# Patient Record
Sex: Male | Born: 1960 | Race: White | Hispanic: No | Marital: Single | Smoking: Former smoker
Health system: Southern US, Community
[De-identification: ages and names within clinical notes are randomized; demographics above are authoritative.]

## PROBLEM LIST (undated history)

## (undated) DIAGNOSIS — L409 Psoriasis, unspecified: Secondary | ICD-10-CM

## (undated) DIAGNOSIS — T4145XA Adverse effect of unspecified anesthetic, initial encounter: Secondary | ICD-10-CM

## (undated) DIAGNOSIS — R55 Syncope and collapse: Principal | ICD-10-CM

## (undated) SURGERY — BRONCHOSCOPY, WITH FLUOROSCOPY
Anesthesia: Moderate Sedation

---

## 1983-11-07 DIAGNOSIS — T8859XA Other complications of anesthesia, initial encounter: Secondary | ICD-10-CM

## 1983-11-07 HISTORY — PX: TONSILLECTOMY: SUR1361

## 1983-11-07 HISTORY — DX: Other complications of anesthesia, initial encounter: T88.59XA

## 1999-11-16 ENCOUNTER — Emergency Department (HOSPITAL_COMMUNITY): Admission: EM | Admit: 1999-11-16 | Discharge: 1999-11-16 | Payer: Self-pay | Admitting: Emergency Medicine

## 2007-02-21 ENCOUNTER — Ambulatory Visit: Payer: Self-pay | Admitting: Gastroenterology

## 2007-02-25 ENCOUNTER — Ambulatory Visit: Payer: Self-pay | Admitting: Gastroenterology

## 2007-07-06 ENCOUNTER — Emergency Department (HOSPITAL_COMMUNITY): Admission: EM | Admit: 2007-07-06 | Discharge: 2007-07-06 | Payer: Self-pay | Admitting: Emergency Medicine

## 2011-03-24 NOTE — Assessment & Plan Note (Signed)
[facility]                         GASTROENTEROLOGY OFFICE NOTE   NAME:[name], [name]                         MRN:          [id number]  DATE:02/21/2007                            DOB:          Dec 31, 1960    Mr. [name] [name] is a 50 year old white male who has been referred through the  courtesy of Dr. Collins [name] for evaluation of rectal bleeding and melena.   Mr. [name] [name] has been in excellent health all of his life without medical  problems.  Approximately a month he developed acute gastroenteritis with  nausea and vomiting, epigastric crampy lower abdominal pain, and  diarrhea with some dark black stools and bright red blood per rectum.  This resolved spontaneously.  He is having regular bowel movements at  this time, but continues with periodic crampy lower abdominal pain  lasting 1 to 2 hours, usually precipitated by eating.  He has had no  diarrhea or further bleeding, although he has had intermittent  hemorrhoidal bleeding for years.  His appetite is good, and his weight  is stable.  He denies specific systemic complaints.  He specifically  denies reflux, dyspepsia, any history of hepatitis or pancreatitis.  He  has not had foreign travel, known infectious disease exposure, or  recurrent antibiotic use.  He was prescribed Levsin by Dr. Collins [name], which  seems to alleviate his pain greatly.  He has never had colonoscopy or  barium studies of his bowels.  Laboratory data from Dr. Collins [name] is  pending.   PAST MEDICAL HISTORY:  Otherwise noncontributory.   FAMILY HISTORY:  Noncontributory in terms of GI illnesses.   SOCIAL HISTORY:  The patient is single and lives with a roommate.  He  has a high school education, and works as a Manufacturing systems engineer.  He  smokes a pack of cigarettes per day.  Denies ethanol use.   REVIEW OF SYSTEMS:  Noncontributory.   EXAMINATION:  He is a healthy-appearing white male, in no distress,  appearing his stated age.  He is 6 feet 3  inches tall, and weighs 242 pounds.  Blood pressure is  114/70, and pulse was 72 and regular.  I could not appreciate stigmata of chronic liver disease.  CHEST:  Showed scattered wheezes and rhonchi throughout both lung  fields.  He was in a regular rhythm without significant murmurs, gallops, or  rubs.  I could not appreciate hepatosplenomegaly, abdominal masses, or  tenderness.  His bowel sounds were normal.  EXTREMITIES:  Were unremarkable.  Mental status was clear.  RECTAL EXAM:  Was deferred.   ASSESSMENT:  Mr. 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   RECOMMENDATIONS:  I have gone ahead and set Mr. [name] [name] up for colonoscopy  at his convenience.  I have asked him to continue to use p.r.n.  sublingual Levsin every 4 to 6 hours.  Pending his colonoscopy results,  we will proceed as needed.     [name] Rea. Jarold Motto, MD, Caleen Essex, FAGA  Electronically Signed    DRP/MedQ  DD:  02/21/2007  DT: 02/21/2007  Job #: [id number]   cc:   [name] [name], M.D.

## 2012-09-25 ENCOUNTER — Emergency Department (HOSPITAL_COMMUNITY): Payer: BC Managed Care – PPO

## 2012-09-25 ENCOUNTER — Inpatient Hospital Stay (HOSPITAL_COMMUNITY): Payer: BC Managed Care – PPO

## 2012-09-25 ENCOUNTER — Observation Stay (HOSPITAL_COMMUNITY)
Admission: EM | Admit: 2012-09-25 | Discharge: 2012-09-27 | Disposition: A | Discharge status: Other Acute Inpatient Hospital | Payer: BC Managed Care – PPO | Attending: Internal Medicine | Admitting: Internal Medicine

## 2012-09-25 ENCOUNTER — Observation Stay: Admit: 2012-09-25 | Payer: Self-pay | Admitting: Internal Medicine

## 2012-09-25 ENCOUNTER — Encounter (HOSPITAL_COMMUNITY): Payer: Self-pay | Admitting: Emergency Medicine

## 2012-09-25 DIAGNOSIS — Z79899 Other long term (current) drug therapy: Secondary | ICD-10-CM | POA: Insufficient documentation

## 2012-09-25 DIAGNOSIS — I2510 Atherosclerotic heart disease of native coronary artery without angina pectoris: Secondary | ICD-10-CM

## 2012-09-25 DIAGNOSIS — R9389 Abnormal findings on diagnostic imaging of other specified body structures: Secondary | ICD-10-CM

## 2012-09-25 DIAGNOSIS — R9431 Abnormal electrocardiogram [ECG] [EKG]: Secondary | ICD-10-CM

## 2012-09-25 DIAGNOSIS — J189 Pneumonia, unspecified organism: Secondary | ICD-10-CM

## 2012-09-25 DIAGNOSIS — R918 Other nonspecific abnormal finding of lung field: Secondary | ICD-10-CM | POA: Insufficient documentation

## 2012-09-25 DIAGNOSIS — L408 Other psoriasis: Secondary | ICD-10-CM | POA: Insufficient documentation

## 2012-09-25 DIAGNOSIS — F17200 Nicotine dependence, unspecified, uncomplicated: Secondary | ICD-10-CM | POA: Insufficient documentation

## 2012-09-25 DIAGNOSIS — R55 Syncope and collapse: Principal | ICD-10-CM

## 2012-09-25 DIAGNOSIS — L409 Psoriasis, unspecified: Secondary | ICD-10-CM | POA: Diagnosis present

## 2012-09-25 HISTORY — DX: Syncope and collapse: R55

## 2012-09-25 HISTORY — DX: Adverse effect of unspecified anesthetic, initial encounter: T41.45XA

## 2012-09-25 HISTORY — DX: Psoriasis, unspecified: L40.9

## 2012-09-25 LAB — BASIC METABOLIC PANEL
CO2: 30 mEq/L (ref 19–32)
Chloride: 101 mEq/L (ref 96–112)
Creatinine, Ser: 1.01 mg/dL (ref 0.50–1.35)
GFR calc Af Amer: >90 mL/min (ref >90)
Potassium: 4.5 mEq/L (ref 3.5–5.1)

## 2012-09-25 LAB — CBC WITH DIFFERENTIAL/PLATELET
Basophils Absolute: 0.1 10*3/uL (ref 0.0–0.1)
Basophils Relative: 1 % (ref 0–1)
HCT: 51.9 % (ref 39.0–52.0)
Hemoglobin: 17.9 g/dL — ABNORMAL HIGH (ref 13.0–17.0)
Lymphocytes Relative: 25 % (ref 12–46)
Monocytes Absolute: 0.7 10*3/uL (ref 0.1–1.0)
Monocytes Relative: 9 % (ref 3–12)
Neutro Abs: 5 10*3/uL (ref 1.7–7.7)
Neutrophils Relative %: 63 % (ref 43–77)
RDW: 14.4 % (ref 11.5–15.5)
WBC: 7.9 10*3/uL (ref 4.0–10.5)

## 2012-09-25 LAB — POCT I-STAT TROPONIN I: Troponin i, poc: 0 ng/mL (ref 0.00–0.08)

## 2012-09-25 MED ORDER — IOHEXOL 350 MG/ML SOLN
100.0000 mL | Freq: Once | INTRAVENOUS | Status: AC | PRN
  Administered 2012-09-25: 100 mL via INTRAVENOUS

## 2012-09-25 NOTE — ED Provider Notes (Signed)
Patient to CDU from Pod A, handoff from [facility] -- patient to ED with 3 episodes of syncope. First was 2 weeks ago, no prodrome. Saw PCP and had labs -- was told he was dehydrated. 2nd episode while driving with brief LOC and brief episode of preceding dizziness. Pt had another episode today where he experienced prodrome of SOB and palps. He was able to sit down prior to syncope.   Was found to have abnormal CXR and CT chest was ordered.   No PE however patient has concerning findings, possible fungal infection. CT reviewed by myself.   Patient takes Enbrel twice a week for psoriasis. Possible cause of immunocompromise. He denies cough, SOB, fever, night sweats.   No travel, has worked at [employer] for 20 years.   Case discussed with Dr. Adela Glimpse who will see and admit. Airborne precautions ordered. Patient and family informed of findings and plan. They are in agreement.   11:12 PM Exam:  Gen NAD; Heart RRR, nml S1,S2, no m/r/g; Lungs CTAB; Abd soft, NT, no rebound or guarding; Ext 2+ pedal pulses bilaterally, no edema.     Renne Crigler, [place] 09/25/12 2312

## 2012-09-25 NOTE — ED Notes (Signed)
Patient transported to CT 

## 2012-09-25 NOTE — ED Notes (Signed)
Report given to Kristin, RN

## 2012-09-25 NOTE — ED Notes (Signed)
Pt c/o syncopal episode today with palpitations and SOB and hyperventilation prior to event; pt sts sat down and did not fall; pt sts hx of similar 2 weeks ago

## 2012-09-25 NOTE — H&P (Signed)
PCP:   PERKINS, Marlowe Sax, PA [employer] [place]   Chief Complaint:   syncope  HPI: [name] [name] is a 51 y.o. male   has no past medical history on file.   Presented with  Today at around 3:30 pm he started to feel that his heart was racing he felt light headed and set on the floor and then lost conciseness for few seconds. Laying down helped immediately. He had prior episodes. Once when he was standing up he synopsized without warning. Last week he synopsized while driving with presyncopal prodrome.  NO shortness of breath no chest pain. Denies any cough, fever , chills or pulmonary complaints. He have had negative PPD done prior to initiation of enbrel and every 6 months there after. No known exposure to Tb and no risk factors.   CT showed possible pulmonary infection AFB vs aspergillosis vs nocardia on differential.    Review of Systems:    Pertinent positives include: palpitations. weight loss (30Lb over 3 months) he was trying to be healthy and was exersising.   Constitutional:  No weight loss, night sweats, Fevers, chills, fatigue,  HEENT:  No headaches, Difficulty swallowing,Tooth/dental problems,Sore throat,  No sneezing, itching, ear ache, nasal congestion, post nasal drip,  Cardio-vascular:  No chest pain, Orthopnea, PND, anasarca, dizziness,no Bilateral lower extremity swelling  GI:  No heartburn, indigestion, abdominal pain, nausea, vomiting, diarrhea, change in bowel habits, loss of appetite, melena, blood in stool, hematemesis Resp:  no shortness of breath at rest. No dyspnea on exertion, No excess mucus, no productive cough, No non-productive cough, No coughing up of blood.No change in color of mucus.No wheezing. Skin:  no rash or lesions. No jaundice GU:  no dysuria, change in color of urine, no urgency or frequency. No straining to urinate.  No flank pain.  Musculoskeletal:  No joint pain or no joint swelling. No decreased range of motion. No back pain.  Psych:    No change in mood or affect. No depression or anxiety. No memory loss.  Neuro: no localizing neurological complaints, no tingling, no weakness, no double vision, no gait abnormality, no slurred speech, no confusion  Otherwise ROS are negative except for above, 10 systems were reviewed  Past Medical History: History reviewed. No pertinent past medical history. Past Surgical History  Procedure Date  . Tonsillectomy      Medications: Prior to Admission medications   Medication Sig Start Date End Date Taking? Authorizing Provider  etanercept (ENBREL) 50 MG/ML injection Inject 50 mg into the skin 2 (two) times a week. Wednesday and Sunday   Yes Historical Provider, MD    Allergies:  No Known Allergies  Social History:  Ambulatory independently Lives at  home   reports that he has been smoking Cigarettes.  He has been smoking about .5 packs per day. He does not have any smokeless tobacco history on file. He reports that he does not drink alcohol or use illicit drugs.   Family History: family history includes Diabetes type II in his brother and father; Heart disease in his other; and Pulmonary fibrosis in his mother.    Physical Exam: Patient Vitals for the past 24 hrs:  BP Temp Temp src Pulse Resp SpO2  09/25/12 2200 124/86 mmHg - - 77  21  96 %  09/25/12 2138 117/74 mmHg - Oral 70  18  100 %  09/25/12 1930 111/75 mmHg - - 65  19  98 %  09/25/12 1830 122/84 mmHg - - 66  19  97 %  09/25/12 1759 127/71 mmHg 98.9 F (37.2 C) Oral 66  24  97 %  09/25/12 1659 152/84 mmHg 98 F (36.7 C) Oral 93  18  97 %    1. General:  in No Acute distress 2. Psychological: Alert and Oriented 3. Head/ENT:   Dry Mucous Membranes                          Head Non traumatic, neck supple                          Normal  Dentition 4. SKIN:  decreased Skin turgor,  Skin clean Dry and intact no rash 5. Heart: Regular rate and rhythm no Murmur, Rub or gallop 6. Lungs: occasional wheezes coarse  breath sounds bilaterally grossly abnormal. 7. Abdomen: Soft, non-tender, Non distended 8. Lower extremities: no clubbing, cyanosis, or edema 9. Neurologically Grossly intact, moving all 4 extremities equally 10. MSK: Normal range of motion  body mass index is unknown because there is no height or weight on file.   Labs on Admission:   [facility] 09/25/12 1701  NA 140  K 4.5  CL 101  CO2 30  GLUCOSE 80  BUN 12  CREATININE 1.01  CALCIUM 9.8  MG --  PHOS --   No results found for this basename: AST:2,ALT:2,ALKPHOS:2,BILITOT:2,PROT:2,ALBUMIN:2 in the last 72 hours No results found for this basename: LIPASE:2,AMYLASE:2 in the last 72 hours  Basename 09/25/12 1701  WBC 7.9  NEUTROABS 5.0  HGB 17.9*  HCT 51.9  MCV 90.9  PLT 183   No results found for this basename: CKTOTAL:3,CKMB:3,CKMBINDEX:3,TROPONINI:3 in the last 72 hours No results found for this basename: TSH,T4TOTAL,FREET3,T3FREE,THYROIDAB in the last 72 hours No results found for this basename: VITAMINB12:2,FOLATE:2,FERRITIN:2,TIBC:2,IRON:2,RETICCTPCT:2 in the last 72 hours No results found for this basename: HGBA1C    CrCl is unknown because there is no height on file for the current visit. ABG No results found for this basename: phart, pco2, po2, hco3, tco2, acidbasedef, o2sat     No results found for this basename: DDIMER     Other results:  I have pearsonaly reviewed this: ECG REPORT  Rate: 88  Rhythm: NSR ST&T Change: no ischemic changes   Cultures: No results found for this basename: sdes, specrequest, cult, reptstatus       Radiological Exams on Admission: Dg Chest 2 View  09/25/2012  *RADIOLOGY REPORT*  Clinical Data: Passed out work.  CHEST - 2 VIEW  Comparison: None.  Findings: Two-view exam shows diffuse interstitial and associated patchy bilateral alveolar opacities with relative sparing of the bases and apices.  Prominence of the main pulmonary arteries raises the question of pulmonary  arterial hypertension. The cardiopericardial silhouette is within normal limits for size. Imaged bony structures of the thorax are intact.  IMPRESSION: Diffuse symmetric interstitial and alveolar disease involving the central lungs without cardiomegaly or Kerley B lines. Imaging features may be related to an infectious or inflammatory process. Pulmonary hemorrhage is not completely excluded.  Prominence of the main pulmonary arteries.   Original Report Authenticated By: Kennith Center, M.D.    Ct Angio Chest W/cm &/or Wo Cm  09/25/2012  *RADIOLOGY REPORT*  Clinical Data: Palpitations.  Loss of consciousness.  Short of breath.  CT ANGIOGRAPHY CHEST  Technique:  Multidetector CT imaging of the chest using the standard protocol during bolus administration of intravenous contrast. Multiplanar reconstructed images including  MIPs were obtained and reviewed to evaluate the vascular anatomy.  Contrast: OMNIPAQUE IOHEXOL 350 MG/ML SOLN  Comparison: None.  Findings: There are no filling defects in the pulmonary arterial tree to suggest acute pulmonary thromboembolism.  Mediastinal and hilar adenopathy are present.  14 mm right paratracheal node on image 29.  9 mm right paratracheal node on image 22.  1.7 cm subcarinal node on image 41.  2.1 cm right hilar node on image 38.  1.7 cm left hilar node on image 41.  All of the nodal tissue contains calcifications.  No pericardial effusion.  No pneumothorax.  No pleural effusion.  Lungs are abnormal.  There are heterogeneous opacities throughout both lungs with a central and upper lobe distribution.  This is characterized by ground-glass opacities, ill-defined pulmonary opacities, and ground-glass opacities.  This is associated with bilateral apical bronchiectasis.  The largest focal opacity in the right upper lobe on image 30 measures 1.7 x 1.4 cm.  There is no subpleural nodularity or nodularity within the airways.  No destructive bone lesion.  No obvious aortic  dissection.  Atherosclerotic calcifications of the LAD are noted.  Normal thyroid gland.  IMPRESSION: No evidence of acute pulmonary thromboembolism.  The constellation of findings include mediastinal and hilar adenopathy containing calcifications associated with heterogeneous bilateral upper lobe opacities and bronchiectasis.  Allergic bronchopulmonary aspergillosis can cause bilateral upper lobe bronchiectasis and the remainder of the findings can be related to fungal infection. Also consider mycobacterium infection. Calcified mediastinal nodes can be seen with treated lymphoma, sarcoidosis, or pneumoconiosis.   Original Report Authenticated By: Jolaine Click, M.D.     Chart has been reviewed  Assessment/Plan  50 yo M with episodes of syncope and grossly abnormal CT of the chest.  Present on Admission:  . Syncope - will admit for father syncope workup. Given some sensations of palpitations would ensure get echo gram and monitor in telemetry for dysrhythmia,  We'll cycle cardiac markers obtained carotid Dopplers. Given abnormal chest CT findings and possibility of Nocardia will obtain MRI of her brain to rule out CNS involvement. Sarcoidosis can affect both lungs and heart although patient is in wrong demographics for that.  . Abnormal chest CT - differential is a rather broad, there's evidence of bronchiectasis. Possibility of infectious process in this patient with immune suppression. Fungal versus mycobacterial infection source. Will put on airborne precautions Spoke with pulmonology who will see him in the morning. May need possible bronchoscopy. Will order AFB cultures. For completion sake we'll get HIV serology.  Prophylaxis:  Lovenox, Protonix  CODE STATUS: FULL CODE  Other plan as per orders.  I have spent a total of  65 min on this admission. Time taken to speak with radiology as well as pulmonology  [name] [name] 09/25/2012, 11:51 PM

## 2012-09-25 NOTE — ED Provider Notes (Signed)
History     CSN: [id number]  Arrival date & time 09/25/12  1648   First MD Initiated Contact with Patient 09/25/12 1758      Chief Complaint  Patient presents with  . Loss of Consciousness  . Palpitations    (Consider location/radiation/quality/duration/timing/severity/associated sxs/prior treatment) HPI Comments: Patient is a 51 year old male with a significant family medical history of early heart disease who presents after a syncopal episode at work. Patient reports sitting at work and feeling heart palpitations and light-headed and proceeding to lose consciousness. Patient reports witnesses say he was out for a few seconds before waking up. Patient reports residual "shakiness" and palpitations after waking up. He denies head trauma. Patient reports 2 other syncopal episodes in the past 2 weeks -- one occurred while he was at home and the other while he was driving. The first 2 episodes, the patient denies the associated palpitations and "shakiness." No aggravating/alleviating factors. Patient denies current symptoms. Patient denies headache, visual changes, neck pain, chest pain, SOB, abdominal pain, NVD, numbness/tingling. Patient is a current everyday smoker.   Patient is a 51 y.o. male presenting with syncope and palpitations.  Loss of Consciousness  Palpitations  Associated symptoms include syncope.    History reviewed. No pertinent past medical history.  History reviewed. No pertinent past surgical history.  History reviewed. No pertinent family history.  History  Substance Use Topics  . Smoking status: Current Every Day Smoker  . Smokeless tobacco: Not on file  . Alcohol Use: No      Review of Systems  Cardiovascular: Positive for palpitations and syncope.  Neurological: Positive for syncope.  All other systems reviewed and are negative.    Allergies  Review of patient's allergies indicates no known allergies.  Home Medications   Current Outpatient Rx    Name  Route  Sig  Dispense  Refill  . ETANERCEPT 50 MG/ML [place] SOLN   Subcutaneous   Inject 50 mg into the skin 2 (two) times a week. Wednesday and Sunday           BP 127/71  Pulse 66  Temp 98.9 F (37.2 C) (Oral)  Resp 24  SpO2 97%  Physical Exam  Nursing note and vitals reviewed. Constitutional: He is oriented to person, place, and time. He appears well-developed and well-nourished. No distress.  HENT:  Head: Normocephalic and atraumatic.  Eyes: Conjunctivae normal and EOM are normal. Pupils are equal, round, and reactive to light. No scleral icterus.  Neck: Normal range of motion. Neck supple.  Cardiovascular: Normal rate and regular rhythm.  Exam reveals no gallop and no friction rub.   No murmur heard. Pulmonary/Chest: Effort normal. He has wheezes. He has rales. He exhibits no tenderness.       Diffuse rales and wheezes noted throughout bilateral lung fields.   Abdominal: Soft. He exhibits no distension. There is no tenderness. There is no rebound and no guarding.  Musculoskeletal: Normal range of motion.  Neurological: He is alert and oriented to person, place, and time. No cranial nerve deficit. Coordination normal.       Strength and sensation equal and intact bilaterally. Cerebellar testing done without difficulty. Speech is goal-oriented. Moves limbs without ataxia.   Skin: Skin is warm and dry. He is not diaphoretic.  Psychiatric: He has a normal mood and affect. His behavior is normal.    ED Course  Procedures (including critical care time)  Labs Reviewed  CBC WITH DIFFERENTIAL - Abnormal; Notable for the  following:    Hemoglobin 17.9 (*)     All other components within normal limits  BASIC METABOLIC PANEL - Abnormal; Notable for the following:    GFR calc non Af Amer 85 (*)     All other components within normal limits  POCT I-STAT TROPONIN I   Dg Chest 2 View  09/25/2012  *RADIOLOGY REPORT*  Clinical Data: Passed out work.  CHEST - 2 VIEW  Comparison:  None.  Findings: Two-view exam shows diffuse interstitial and associated patchy bilateral alveolar opacities with relative sparing of the bases and apices.  Prominence of the main pulmonary arteries raises the question of pulmonary arterial hypertension. The cardiopericardial silhouette is within normal limits for size. Imaged bony structures of the thorax are intact.  IMPRESSION: Diffuse symmetric interstitial and alveolar disease involving the central lungs without cardiomegaly or Kerley B lines. Imaging features may be related to an infectious or inflammatory process. Pulmonary hemorrhage is not completely excluded.  Prominence of the main pulmonary arteries.   Original Report Authenticated By: Kennith Center, M.D.      1. Syncope   2. Pulmonary infection       MDM  6:12 PM Labs pending. Chest xray shows diffuse symmetrical interstitial and alveolar disease and prominence of main pulmonary arteries.    7:08 PM Patient will have CT angio of chest due to concerning chest xray. Patient will go to CDU. Patient signed out to [facility], PA-C      [place], [place] 09/25/12 [phone]

## 2012-09-25 NOTE — ED Notes (Signed)
Pt A.O. X 4. Respirations even and regular. Vitals stable. Skin warm,dry, intact. Denies Pain. Denies SOB. Denies Chest pain. Family at bedside. No further needs at this time. Up dated on plan of care. Aware that Hospitalist will becoming to assess him and continue his plan of care.

## 2012-09-25 NOTE — ED Notes (Signed)
Pt returned from CT °

## 2012-09-25 NOTE — ED Notes (Signed)
Pt reports a syncopal episode today; pt states he has had previous syncopal episodes in the past with his last episode being last Wednesday; pt reports that today he had palpitations prior to today's episode and that he was nausea this morning;

## 2012-09-26 ENCOUNTER — Observation Stay (HOSPITAL_COMMUNITY): Payer: BC Managed Care – PPO

## 2012-09-26 ENCOUNTER — Encounter (HOSPITAL_COMMUNITY): Payer: Self-pay | Admitting: General Practice

## 2012-09-26 ENCOUNTER — Encounter (HOSPITAL_COMMUNITY)
Admission: EM | Disposition: A | Discharge status: Other Acute Inpatient Hospital | Payer: Self-pay | Source: Home / Self Care | Attending: Emergency Medicine

## 2012-09-26 DIAGNOSIS — J984 Other disorders of lung: Secondary | ICD-10-CM

## 2012-09-26 DIAGNOSIS — R9389 Abnormal findings on diagnostic imaging of other specified body structures: Secondary | ICD-10-CM

## 2012-09-26 DIAGNOSIS — L409 Psoriasis, unspecified: Secondary | ICD-10-CM | POA: Diagnosis present

## 2012-09-26 DIAGNOSIS — R55 Syncope and collapse: Secondary | ICD-10-CM

## 2012-09-26 DIAGNOSIS — F17200 Nicotine dependence, unspecified, uncomplicated: Secondary | ICD-10-CM | POA: Diagnosis present

## 2012-09-26 DIAGNOSIS — R9431 Abnormal electrocardiogram [ECG] [EKG]: Secondary | ICD-10-CM | POA: Diagnosis present

## 2012-09-26 HISTORY — PX: VIDEO BRONCHOSCOPY: SHX5072

## 2012-09-26 LAB — PROTIME-INR
INR: 0.99 (ref 0.00–1.49)
Prothrombin Time: 13 seconds (ref 11.6–15.2)

## 2012-09-26 LAB — COMPREHENSIVE METABOLIC PANEL
ALT: 14 U/L (ref 0–53)
Alkaline Phosphatase: 84 U/L (ref 39–117)
BUN: 11 mg/dL (ref 6–23)
CO2: 30 mEq/L (ref 19–32)
Chloride: 105 mEq/L (ref 96–112)
GFR calc Af Amer: 86 mL/min — ABNORMAL LOW (ref >90)
GFR calc non Af Amer: 74 mL/min — ABNORMAL LOW (ref >90)
Glucose, Bld: 119 mg/dL — ABNORMAL HIGH (ref 70–99)
Potassium: 4.7 mEq/L (ref 3.5–5.1)
Sodium: 144 mEq/L (ref 135–145)
Total Bilirubin: 0.4 mg/dL (ref 0.3–1.2)

## 2012-09-26 LAB — TROPONIN I
Troponin I: <0.3 ng/mL (ref <0.30)
Troponin I: <0.3 ng/mL (ref <0.30)

## 2012-09-26 LAB — PRO B NATRIURETIC PEPTIDE: Pro B Natriuretic peptide (BNP): 14.6 pg/mL (ref 0–125)

## 2012-09-26 LAB — IGE: IgE (Immunoglobulin E), Serum: 6.9 IU/mL (ref 0.0–180.0)

## 2012-09-26 LAB — MAGNESIUM: Magnesium: 2.3 mg/dL (ref 1.5–2.5)

## 2012-09-26 LAB — CBC
HCT: 50.5 % (ref 39.0–52.0)
MCHC: 35.2 g/dL (ref 30.0–36.0)
Platelets: 171 10*3/uL (ref 150–400)
RDW: 14.5 % (ref 11.5–15.5)
WBC: 7.5 10*3/uL (ref 4.0–10.5)

## 2012-09-26 SURGERY — BRONCHOSCOPY, WITH FLUOROSCOPY
Anesthesia: Moderate Sedation | Laterality: Bilateral

## 2012-09-26 MED ORDER — ONDANSETRON HCL 4 MG/2ML IJ SOLN: 4.0000 mg | Freq: Four times a day (QID) | INTRAMUSCULAR | Status: DC | PRN

## 2012-09-26 MED ORDER — LIDOCAINE HCL 2 % EX GEL
CUTANEOUS | Status: DC | PRN
  Administered 2012-09-26: 1

## 2012-09-26 MED ORDER — SODIUM CHLORIDE 0.9 % IJ SOLN
3.0000 mL | Freq: Two times a day (BID) | INTRAMUSCULAR | Status: DC
  Administered 2012-09-26 – 2012-09-27 (×4): 3 mL via INTRAVENOUS

## 2012-09-26 MED ORDER — FENTANYL CITRATE 0.05 MG/ML IJ SOLN
INTRAMUSCULAR | Status: AC
  Filled 2012-09-26: qty 4

## 2012-09-26 MED ORDER — PHENYLEPHRINE HCL 0.25 % NA SOLN
NASAL | Status: DC | PRN
  Administered 2012-09-26: 2 via NASAL

## 2012-09-26 MED ORDER — ONDANSETRON HCL 4 MG/2ML IJ SOLN: 4.0000 mg | Freq: Three times a day (TID) | INTRAMUSCULAR | Status: AC | PRN

## 2012-09-26 MED ORDER — BUTAMBEN-TETRACAINE-BENZOCAINE 2-2-14 % EX AERO
1.0000 | INHALATION_SPRAY | Freq: Once | CUTANEOUS | Status: DC
  Filled 2012-09-26: qty 56

## 2012-09-26 MED ORDER — DOCUSATE SODIUM 100 MG PO CAPS
100.0000 mg | ORAL_CAPSULE | Freq: Two times a day (BID) | ORAL | Status: DC
  Administered 2012-09-26 – 2012-09-27 (×4): 100 mg via ORAL
  Filled 2012-09-26 (×5): qty 1

## 2012-09-26 MED ORDER — HYDROCODONE-ACETAMINOPHEN 5-325 MG PO TABS: 1.0000 | ORAL_TABLET | ORAL | Status: DC | PRN

## 2012-09-26 MED ORDER — SODIUM CHLORIDE 0.9 % IV SOLN
INTRAVENOUS | Status: AC
  Administered 2012-09-26: 04:00:00 via INTRAVENOUS

## 2012-09-26 MED ORDER — SODIUM CHLORIDE 0.9 % IV SOLN
INTRAVENOUS | Status: DC
  Administered 2012-09-26: 10 mL/h via INTRAVENOUS

## 2012-09-26 MED ORDER — FENTANYL CITRATE 0.05 MG/ML IJ SOLN
INTRAMUSCULAR | Status: DC | PRN
  Administered 2012-09-26 (×4): 25 ug via INTRAVENOUS

## 2012-09-26 MED ORDER — PHENYLEPHRINE HCL 0.25 % NA SOLN
1.0000 | Freq: Four times a day (QID) | NASAL | Status: DC | PRN
  Filled 2012-09-26: qty 15

## 2012-09-26 MED ORDER — LIDOCAINE HCL (PF) 1 % IJ SOLN
INTRAMUSCULAR | Status: DC | PRN
  Administered 2012-09-26 (×2): 6 mL

## 2012-09-26 MED ORDER — ACETAMINOPHEN 650 MG RE SUPP: 650.0000 mg | Freq: Four times a day (QID) | RECTAL | Status: DC | PRN

## 2012-09-26 MED ORDER — LIDOCAINE HCL 2 % EX GEL
Freq: Once | CUTANEOUS | Status: DC
  Filled 2012-09-26: qty 5

## 2012-09-26 MED ORDER — ACETAMINOPHEN 325 MG PO TABS: 650.0000 mg | ORAL_TABLET | Freq: Four times a day (QID) | ORAL | Status: DC | PRN

## 2012-09-26 MED ORDER — ALBUTEROL SULFATE (5 MG/ML) 0.5% IN NEBU: 2.5000 mg | INHALATION_SOLUTION | RESPIRATORY_TRACT | Status: DC | PRN

## 2012-09-26 MED ORDER — ONDANSETRON HCL 4 MG PO TABS: 4.0000 mg | ORAL_TABLET | Freq: Four times a day (QID) | ORAL | Status: DC | PRN

## 2012-09-26 MED ORDER — ENOXAPARIN SODIUM 40 MG/0.4ML ~~LOC~~ SOLN
40.0000 mg | SUBCUTANEOUS | Status: DC
  Filled 2012-09-26 (×2): qty 0.4

## 2012-09-26 MED ORDER — MIDAZOLAM HCL 5 MG/ML IJ SOLN
INTRAMUSCULAR | Status: AC
  Filled 2012-09-26: qty 2

## 2012-09-26 MED ORDER — ASPIRIN EC 81 MG PO TBEC
81.0000 mg | DELAYED_RELEASE_TABLET | Freq: Every day | ORAL | Status: DC
  Administered 2012-09-26 – 2012-09-27 (×2): 81 mg via ORAL
  Filled 2012-09-26 (×2): qty 1

## 2012-09-26 MED ORDER — LEVOFLOXACIN IN D5W 500 MG/100ML IV SOLN
500.0000 mg | INTRAVENOUS | Status: DC
  Administered 2012-09-26: 500 mg via INTRAVENOUS
  Filled 2012-09-26 (×2): qty 100

## 2012-09-26 MED ORDER — MIDAZOLAM HCL 5 MG/ML IJ SOLN
INTRAMUSCULAR | Status: DC | PRN
  Administered 2012-09-26 (×4): 1 mg via INTRAVENOUS

## 2012-09-26 NOTE — Op Note (Signed)
Indication : Bilateral unexplained  infiltrates in this ex smoker with psoriasis on enbrel. Written informed consent was obtained prior to the procedure. The risks of the procedure including coughing, bleeding and the small chance of lung puncture requiring chest tube were discussed in great detail. The benefits & alternatives including serial follow up were also discussed.   4 mg versed & 100  mcg fentnayl used in divided doses during the procedure Bronchoscope entered from the right nare. Upper airway nml Vocal cords showed nml appearance & motion. Trachea & bronchial tree examined to the subsegmental level. Mild amount of white, mucoid  secretions were noted. No endobronchial lesions seen. Trans bronchial biopsies x 4 were obtained from the RUL under fluoroscopy. BAL was also obtained from the RUL.  There was moderate coughing  during the procedure.  A CXR will be performed to r/o presence of pneumothorax.  ALVA,RAKESH V.  [phone]

## 2012-09-26 NOTE — Progress Notes (Signed)
*  PRELIMINARY RESULTS* Vascular Ultrasound Carotid Duplex (Doppler) has been completed.  Preliminary findings: Bilaterally within normal limits.  Farrel Demark, RDMS, RVT  09/26/2012, 2:39 PM

## 2012-09-26 NOTE — Progress Notes (Addendum)
Pt seen and examined, admitted this am 1. Syncope:  3 syncopal episodes in the last 2 weeks, symptoms concerning for arrhythmias, keep on tele, check 2D echo tsh normal, EKG with early repol changes in Anterior leads, cardiac enzymes negative and symptom free. Will repeat EKG  Will request Cardiology eval as well, may need event monitor if workup unrevealing 2. Abnormal CT Chest concerning for atypical bacterial vs fungal infection for Bronchoscopy and biopsy today, multiple PPDs negative to date HIV negative, appreciate Pulm input. 3. Psoriasis on Enbrel for 5 years   Zannie Cove, MD [phone]

## 2012-09-26 NOTE — ED Provider Notes (Signed)
This was a shared patient encounter.  [name] [name] presents after a syncopal episode.  Notably, the patient has had several episodes recently.  After additional evaluation, discussion, became clear that the patient is relatively immunocompromised.  A chest CT demonstrates lesions concerning for opportunistic infection.  The patient was admitted for further evaluation and management  Gerhard Munch, MD 09/26/12 [phone]

## 2012-09-26 NOTE — Progress Notes (Deleted)
Pt. With 8 beat run of VTach this am. Pt. Asymptomatic and resting in bed quietly. Pt. Denies pain. No signs of distress or discomfort noted. MD notified via text page. Will continue to monitor pt. For changes in condition. [place] Osmer Cherrell  

## 2012-09-26 NOTE — Progress Notes (Signed)
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

## 2012-09-26 NOTE — Consult Note (Addendum)
Name: [name] [name] MRN: [id number] DOB: September 15, 1961  LOS: 1  LB PCCM  History of Present Illness: This is a very pleasant 51 year old male with a past medical history significant for psoriasis treated with Enbrel who was admitted to [facility] on 09/25/2012 for syncope. Pulmonary critical care medicine was consulted for a markedly abnormal CT chest performed as part of the workup for syncope.  This gentleman no significant past respiratory illnesses but about 2 months ago he states that he developed fever, chills, shortness of breath, fatigue, and cough productive of yellow sputum. After 2 days of symptoms he sought care with his primary care physician in [facility] and was given a Z-Pak. On day 5 of the Z-Pak his symptoms resolved and have not recurred. At that time he said he was noted to have wheezing which he has never been told in the past.  He came to the emergency room on 09/25/2012 for 3 separate episodes of syncope. These have occurred at work either while sitting or standing and are not associated with chest pain.  He has been treated with Enbrel for the last 5 years for psoriasis. He has had PPDs placed every 6 months and they have always been negative. He works at [employer] in [employer] and he has not had heavy dust exposure there. He has been helping a friend with some woodworking (cedar) for a few days in the last 2 months. In the last 2 months he has gotten a new dog and he has had dogs over the years. He does not have mold or mildew in his living space nor does he have a hot tub or humidifier.  And he has not had cough or sputum production since being treated with antibiotics over one month ago. He has lost 30 pounds with diet and exercise in the last several months. He denies night sweats or unexplained fevers or chills.   Lines / Drains: Peripheral IV  Cultures / Sepsis markers: 09/26/2012 sputum culture>> 09/26/2012 sputum  AFB>>  Antibiotics: None  Tests / Events: 09/25/2012 CT angio chest>> there is no pulmonary embolism; there is mediastinal and hilar calcified lymphadenopathy; windows of the lung parenchyma reveal an upper lobe predominant nodular, micronodular and groundglass process     Past Medical History  Diagnosis Date  . Complication of anesthesia 1985    "took me 2 days 2 come out of it; doesn't take much to put me to sleep" (09/26/2012)  . Arthritis     "ankles and wrists" (09/26/2012)  . Psoriasis     "legs and elbows" (09/26/2012)  . Syncope and collapse     "3 rimes in the last 2 wk"(09/26/2012)   Past Surgical History  Procedure Date  . Tonsillectomy 1985   Prior to Admission medications   Medication Sig Start Date End Date Taking? Authorizing Provider  etanercept (ENBREL) 50 MG/ML injection Inject 50 mg into the skin 2 (two) times a week. Wednesday and Sunday   Yes Historical Provider, MD   No Known Allergies Family History  Problem Relation Age of Onset  . Pulmonary fibrosis Mother   . Diabetes type II Father   . Diabetes type II Brother   . Heart disease Other    Social History  reports that he has been smoking Cigarettes.  He has a 4.95 pack-year smoking history. He has never used smokeless tobacco. He reports that he drinks alcohol. He reports that he does not use illicit drugs.  Review Of Systems  Gen: Denies fever, chills, unexplained weight change, fatigue, night sweats HEENT: Denies blurred vision, double vision, hearing loss, tinnitus, sinus congestion, rhinorrhea, sore throat, neck stiffness, dysphagia PULM: Denies shortness of breath, cough, sputum production, hemoptysis, wheezing CV: Per history of present illness GI: Denies abdominal pain, nausea, vomiting, diarrhea, hematochezia, melena, constipation, change in bowel habits GU: Denies dysuria, hematuria, polyuria, oliguria, urethral discharge Endocrine: Denies hot or cold intolerance, polyuria, polyphagia or  appetite change Derm: Denies rash, dry skin, scaling or peeling skin change Heme: Denies easy bruising, bleeding, bleeding gums Neuro: Denies headache, numbness, weakness, slurred speech, loss of memory or consciousness  Vital Signs:   Filed Vitals:   09/25/12 2138 09/25/12 2200 09/26/12 0100 09/26/12 0122  BP: 117/74 124/86 122/76 117/77  Pulse: 70 77 75 64  Temp:   98.3 F (36.8 C) 98 F (36.7 C)  TempSrc: Oral  Oral Oral  Resp: 18 21  18   Height:    6\' 3"  (1.905 m)  Weight:    96.843 kg (213 lb 8 oz)  SpO2: 100% 96% 98% 100%    Physical Examination: Gen: well appearing, no acute distress HEENT: NCAT, PERRL, EOMi, OP clear,  Neck: supple without masses PULM: Insp and exp wheezing bilaterally CV: RRR, no mgr, no JVD AB: BS+, soft, nontender, no hsm Ext: warm, no edema, no clubbing, no cyanosis Derm: no rash or skin breakdown Neuro: A&Ox4, CN II-XII intact, strength 5/5 in all 4 extremities Psyche: Normal mood and affect  Labs and Imaging:   CBC    Component Value Date/Time   WBC 7.5 09/26/2012 0019   RBC 5.63 09/26/2012 0019   HGB 17.8* 09/26/2012 0019   HCT 50.5 09/26/2012 0019   PLT 171 09/26/2012 0019   MCV 89.7 09/26/2012 0019   MCH 31.6 09/26/2012 0019   MCHC 35.2 09/26/2012 0019   RDW 14.5 09/26/2012 0019   LYMPHSABS 2.0 09/25/2012 1701   MONOABS 0.7 09/25/2012 1701   EOSABS 0.1 09/25/2012 1701   BASOSABS 0.1 09/25/2012 1701    BMET    Component Value Date/Time   NA 144 09/26/2012 0019   K 4.7 09/26/2012 0019   CL 105 09/26/2012 0019   CO2 30 09/26/2012 0019   GLUCOSE 119* 09/26/2012 0019   BUN 11 09/26/2012 0019   CREATININE 1.13 09/26/2012 0019   CALCIUM 10.0 09/26/2012 0019   GFRNONAA 74* 09/26/2012 0019   GFRAA 86* 09/26/2012 0019    ABG No results found for this basename: phart, pco2, pco2art, po2, po2art, hco3, tco2, acidbasedef, o2sat   Impression: 1) Abnormal Chest CT 2) Psoriasis 3) Syncope 4) Immunocompromised  state   Assessment and Plan:  This is a very pleasant 51 year old male who has been on Enbrel for the last 5 years for psoriasis who was admitted on 09/25/2012 to [facility] for evaluation of syncope. He was found to have a markedly abnormal CT chest when the study was performed to look for pulmonary embolism. There is no PE but he has a nodular and groundglass process in the upper lobes of his lungs bilaterally. This is associated with a markedly abnormal respiratory exam he has basically no symptoms aside from a possible URI versus pneumonia about 2 months ago.  I agree with Dr. Bonnielee Haff that the differential diagnosis here includes a fungal infection, other atypical infectious process such as Nocardia, and sarcoidosis. I would add adenocarcinoma, hypersensitivity pneumonitis and less likely chronic eosinophilic pneumonia.  I do not think that this is active tuberculosis and  I do not think that this is ABPA as he does not have sputum production or an underlying lung disease.  I will check an urine histo antigen and an serum IgE.  At this point he needs to have a bronchoscopy performed with transbronchial biopsies and a BAL. We will send this for histology, cytology, cell count, and culture for bacterial, fungal, and AFB organisms. We will try to make arrangements for this to be performed today.    Heber Ray, M.D. Pulmonary and Critical Care Medicine [facility] Pager: [phone]  09/26/2012, 5:12 AM

## 2012-09-26 NOTE — Progress Notes (Signed)
Video bronchoscopy procedure performed. Forcep biopsy intervention performed. Bronchial washing intervention performed.

## 2012-09-26 NOTE — Progress Notes (Signed)
Pt returned to room post endoscopy.

## 2012-09-26 NOTE — Progress Notes (Signed)
  Echocardiogram 2D Echocardiogram has been performed.  [name] [name] 09/26/2012, 2:55 PM

## 2012-09-26 NOTE — ED Provider Notes (Signed)
This was a shared patient encounter.  Gentleman presents after a syncopal episode.  Notably, the patient has had several episodes recently.  After additional evaluation, discussion, became clear that the patient is relatively immunocompromised.  A chest CT demonstrates lesions concerning for opportunistic infection.  The patient was admitted for further evaluation and management  Suleyman Ehrman, MD 09/26/12 0018 

## 2012-09-26 NOTE — Consult Note (Signed)
Reason for Consult: Syncope  Requesting Physician: [facility]  HPI: This is a 51 y.o. male with a past medical history of psoriasis. He works at [employer]. He was at work 09/25/12 when he developed palpitations and the near syncope- "I had to lay down".  His symptoms resolved spontaneously. He admits to two prior episodes. The first was 2 weeks ago, again at work while he was standing talking to co-workers. He was told he collapsed, hit his head. The second episode was in his car near work when he suddenly felt sick and then found that he had driven up onto a curb. Since admission he has had some bradycardia but nothing that would cause syncope or pre syncope. He admits to a febrile URI 2 months ago, treated with some relief with Z Pak. He says he has some wheezing but denies orthopnea or PND. His chest CT shows diffuse ground glass appearance (not CHF). A pulmonary work up is underway and he had a bronchoscopy today.Echocardiogram is pending.  PMHx:  Past Medical History  Diagnosis Date  . Complication of anesthesia 1985    "took me 2 days 2 come out of it; doesn't take much to put me to sleep" (09/26/2012)  . Arthritis     "ankles and wrists" (09/26/2012)  . Psoriasis     "legs and elbows" (09/26/2012)  . Syncope and collapse     "3 rimes in the last 2 wk"(09/26/2012)   Past Surgical History  Procedure Date  . Tonsillectomy 1985    FAMHx: Family History  Problem Relation Age of Onset  . Pulmonary fibrosis Mother   . Diabetes type II Father   . Diabetes type II Brother   . Heart disease Other     SOCHx:  reports that he has been smoking Cigarettes.  He has a 4.95 pack-year smoking history. He has never used smokeless tobacco. He reports that he drinks alcohol. He reports that he does not use illicit drugs.  ALLERGIES: No Known Allergies  ROS: Pertinent items are noted in HPI.  HOME MEDICATIONS: Prescriptions prior to admission  Medication Sig Dispense Refill  . etanercept  (ENBREL) 50 MG/ML injection Inject 50 mg into the skin 2 (two) times a week. Wednesday and Sunday        HOSPITAL MEDICATIONS: I have reviewed the patient's current medications.  VITALS: Blood pressure 106/64, pulse 64, temperature 98 F (36.7 C), temperature source Oral, resp. rate 20, height 6\' 3"  (1.905 m), weight 96.843 kg (213 lb 8 oz), SpO2 94.00%.  PHYSICAL EXAM: General appearance: alert, cooperative and no distress Neck: no carotid bruit, no JVD and thyroid not enlarged, symmetric, no tenderness/mass/nodules Lungs: diffuse rhonci and wheezing Heart: regular rate and rhythm Abdomen: soft, non-tender; bowel sounds normal; no masses,  no organomegaly Extremities: extremities normal, atraumatic, no cyanosis or edema Pulses: 2+ and symmetric Skin: Skin color, texture, turgor normal. No rashes or lesions Neurologic: Grossly normal  LABS: Results for orders placed during the hospital encounter of 09/25/12 (from the past 48 hour(s))  CBC WITH DIFFERENTIAL     Status: Abnormal   Collection Time   09/25/12  5:01 PM      Component Value Range Comment   WBC 7.9  4.0 - 10.5 K/uL    RBC 5.71  4.22 - 5.81 MIL/uL    Hemoglobin 17.9 (*) 13.0 - 17.0 g/dL    HCT 81.1  91.4 - 78.2 %    MCV 90.9  78.0 - 100.0 fL  MCH 31.3  26.0 - 34.0 pg    MCHC 34.5  30.0 - 36.0 g/dL    RDW 16.1  09.6 - 04.5 %    Platelets 183  150 - 400 K/uL    Neutrophils Relative 63  43 - 77 %    Neutro Abs 5.0  1.7 - 7.7 K/uL    Lymphocytes Relative 25  12 - 46 %    Lymphs Abs 2.0  0.7 - 4.0 K/uL    Monocytes Relative 9  3 - 12 %    Monocytes Absolute 0.7  0.1 - 1.0 K/uL    Eosinophils Relative 2  0 - 5 %    Eosinophils Absolute 0.1  0.0 - 0.7 K/uL    Basophils Relative 1  0 - 1 %    Basophils Absolute 0.1  0.0 - 0.1 K/uL   BASIC METABOLIC PANEL     Status: Abnormal   Collection Time   09/25/12  5:01 PM      Component Value Range Comment   Sodium 140  135 - 145 mEq/L    Potassium 4.5  3.5 - 5.1 mEq/L     Chloride 101  96 - 112 mEq/L    CO2 30  19 - 32 mEq/L    Glucose, Bld 80  70 - 99 mg/dL    BUN 12  6 - 23 mg/dL    Creatinine, Ser 4.09  0.50 - 1.35 mg/dL    Calcium 9.8  8.4 - 81.1 mg/dL    GFR calc non Af Amer 85 (*) >90 mL/min    GFR calc Af Amer >90  >90 mL/min   POCT I-STAT TROPONIN I     Status: Normal   Collection Time   09/25/12  5:33 PM      Component Value Range Comment   Troponin i, poc 0.00  0.00 - 0.08 ng/mL    Comment 3            TROPONIN I     Status: Normal   Collection Time   09/26/12 12:17 AM      Component Value Range Comment   Troponin I <0.30  <0.30 ng/mL   MAGNESIUM     Status: Normal   Collection Time   09/26/12 12:19 AM      Component Value Range Comment   Magnesium 2.3  1.5 - 2.5 mg/dL   PHOSPHORUS     Status: Normal   Collection Time   09/26/12 12:19 AM      Component Value Range Comment   Phosphorus 4.2  2.3 - 4.6 mg/dL   TSH     Status: Normal   Collection Time   09/26/12 12:19 AM      Component Value Range Comment   TSH 3.153  0.350 - 4.500 uIU/mL   COMPREHENSIVE METABOLIC PANEL     Status: Abnormal   Collection Time   09/26/12 12:19 AM      Component Value Range Comment   Sodium 144  135 - 145 mEq/L    Potassium 4.7  3.5 - 5.1 mEq/L    Chloride 105  96 - 112 mEq/L    CO2 30  19 - 32 mEq/L    Glucose, Bld 119 (*) 70 - 99 mg/dL    BUN 11  6 - 23 mg/dL    Creatinine, Ser 9.14  0.50 - 1.35 mg/dL    Calcium 78.2  8.4 - 10.5 mg/dL    Total Protein 7.0  6.0 -  8.3 g/dL    Albumin 3.8  3.5 - 5.2 g/dL    AST 18  0 - 37 U/L    ALT 14  0 - 53 U/L    Alkaline Phosphatase 84  39 - 117 U/L    Total Bilirubin 0.4  0.3 - 1.2 mg/dL    GFR calc non Af Amer 74 (*) >90 mL/min    GFR calc Af Amer 86 (*) >90 mL/min   CBC     Status: Abnormal   Collection Time   09/26/12 12:19 AM      Component Value Range Comment   WBC 7.5  4.0 - 10.5 K/uL    RBC 5.63  4.22 - 5.81 MIL/uL    Hemoglobin 17.8 (*) 13.0 - 17.0 g/dL    HCT 16.1  09.6 - 04.5 %    MCV  89.7  78.0 - 100.0 fL    MCH 31.6  26.0 - 34.0 pg    MCHC 35.2  30.0 - 36.0 g/dL    RDW 40.9  81.1 - 91.4 %    Platelets 171  150 - 400 K/uL   HIV ANTIBODY (ROUTINE TESTING)     Status: Normal   Collection Time   09/26/12 12:19 AM      Component Value Range Comment   HIV NON REACTIVE  NON REACTIVE   TROPONIN I     Status: Normal   Collection Time   09/26/12  7:18 AM      Component Value Range Comment   Troponin I <0.30  <0.30 ng/mL   IGE     Status: Normal   Collection Time   09/26/12  7:35 AM      Component Value Range Comment   IgE (Immunoglobulin E), Serum 6.9  0.0 - 180.0 IU/mL   PROTIME-INR     Status: Normal   Collection Time   09/26/12  7:35 AM      Component Value Range Comment   Prothrombin Time 13.0  11.6 - 15.2 seconds    INR 0.99  0.00 - 1.49   TROPONIN I     Status: Normal   Collection Time   09/26/12  3:25 PM      Component Value Range Comment   Troponin I <0.30  <0.30 ng/mL   PRO B NATRIURETIC PEPTIDE     Status: Normal   Collection Time   09/26/12  3:25 PM      Component Value Range Comment   Pro B Natriuretic peptide (BNP) 14.6  0 - 125 pg/mL     IMAGING: Dg Chest 2 View  09/25/2012  *RADIOLOGY REPORT*  Clinical Data: Passed out work.  CHEST - 2 VIEW  Comparison: None.  Findings: Two-view exam shows diffuse interstitial and associated patchy bilateral alveolar opacities with relative sparing of the bases and apices.  Prominence of the main pulmonary arteries raises the question of pulmonary arterial hypertension. The cardiopericardial silhouette is within normal limits for size. Imaged bony structures of the thorax are intact.  IMPRESSION: Diffuse symmetric interstitial and alveolar disease involving the central lungs without cardiomegaly or Kerley B lines. Imaging features may be related to an infectious or inflammatory process. Pulmonary hemorrhage is not completely excluded.  Prominence of the main pulmonary arteries.   Original Report Authenticated By:  Kennith Center, M.D.    Ct Head Wo Contrast  09/25/2012  *RADIOLOGY REPORT*  Clinical Data: Syncope  CT HEAD WITHOUT CONTRAST  Technique:  Contiguous axial images were obtained from the  base of the skull through the vertex without contrast.  Comparison: None.  Findings: There is no evidence for acute hemorrhage, hydrocephalus, mass lesion, or abnormal extra-axial fluid collection.  No definite CT evidence for acute infarction.  The visualized paranasal sinuses and mastoid air cells are predominately clear.  IMPRESSION: No CT evidence for acute intracranial abnormality.   Original Report Authenticated By: Jearld Lesch, M.D.    Ct Angio Chest W/cm &/or Wo Cm  09/25/2012  *RADIOLOGY REPORT*  Clinical Data: Palpitations.  Loss of consciousness.  Short of breath.  CT ANGIOGRAPHY CHEST  Technique:  Multidetector CT imaging of the chest using the standard protocol during bolus administration of intravenous contrast. Multiplanar reconstructed images including MIPs were obtained and reviewed to evaluate the vascular anatomy.  Contrast: OMNIPAQUE IOHEXOL 350 MG/ML SOLN  Comparison: None.  Findings: There are no filling defects in the pulmonary arterial tree to suggest acute pulmonary thromboembolism.  Mediastinal and hilar adenopathy are present.  14 mm right paratracheal node on image 29.  9 mm right paratracheal node on image 22.  1.7 cm subcarinal node on image 41.  2.1 cm right hilar node on image 38.  1.7 cm left hilar node on image 41.  All of the nodal tissue contains calcifications.  No pericardial effusion.  No pneumothorax.  No pleural effusion.  Lungs are abnormal.  There are heterogeneous opacities throughout both lungs with a central and upper lobe distribution.  This is characterized by ground-glass opacities, ill-defined pulmonary opacities, and ground-glass opacities.  This is associated with bilateral apical bronchiectasis.  The largest focal opacity in the right upper lobe on image 30  measures 1.7 x 1.4 cm.  There is no subpleural nodularity or nodularity within the airways.  No destructive bone lesion.  No obvious aortic dissection.  Atherosclerotic calcifications of the LAD are noted.  Normal thyroid gland.  IMPRESSION: No evidence of acute pulmonary thromboembolism.  The constellation of findings include mediastinal and hilar adenopathy containing calcifications associated with heterogeneous bilateral upper lobe opacities and bronchiectasis.  Allergic bronchopulmonary aspergillosis can cause bilateral upper lobe bronchiectasis and the remainder of the findings can be related to fungal infection. Also consider mycobacterium infection. Calcified mediastinal nodes can be seen with treated lymphoma, sarcoidosis, or pneumoconiosis.   Original Report Authenticated By: Jolaine Click, M.D.    Dg Chest Port 1 View  09/26/2012  *RADIOLOGY REPORT*  Clinical Data: Post bronchoscopy  PORTABLE CHEST - 1 VIEW  Comparison: 09/25/2012  Findings: Diffuse bilateral   infiltrates are unchanged.  Negative for pneumothorax or pleural effusion.  IMPRESSION: No change in bilateral   infiltrates.  No pneumothorax post bronchoscopy.   Original Report Authenticated By: Janeece Riggers, M.D.    EKG- NSR/SB LVH by voltage with repol changes  IMPRESSION:  Principal Problem:  *Syncope, r/o arrythmia  (? 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 Atypical pneumonia   Smoker  Abnormal EKG, LVH with repol  Psoriasis, on Enbrel prior to admission (an imunosuppresent)   RECOMMENDATION: MD to see, echo pending, further recommendations based on echo findings.  Time Spent Directly with Patient: 45  minutes  KILROY,LUKE K 09/26/2012, 4:27 PM   I have seen and examined the patient along with Abelino Derrick, PAC.  I have reviewed the chart, notes and new data.  I agree with PA's note.  Echo shows no evidence of structural heart disease. On monitor (so far) he has just had some  mild sinus bradycardia.  Recurrent syncope - unheralded, random and brief - has the hallmarks of arrhythmia. No evidence to support hypoxia as cause and no cough preceding it. Does not sound like vagal events.  Concomitant extensive pulmonary changes and mediastinal adenopathy. In a patient on Enbrel Rx (for psoriasis) this is likely an immunosuppression related infection (mycobacterial, fungal, etc.) and would appear not to be connected to syncope.  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   If no cause of syncope is identified during his work-up/monitoring this weekend , I recommend he receive an implantable loop recorder.  Incidental note is mad of moderate calcification in the proximal-mid LAD artery on CT chest and once current work-up is complete a stress myocardial perfusion study is reasonable.   Thurmon Fair, MD, [facility] [phone] 09/26/2012, 6:59 PM

## 2012-09-26 NOTE — Progress Notes (Signed)
Utilization Review Completed.   Erica Osuna, RN, BSN Nurse Case Manager  [phone]  

## 2012-09-26 NOTE — Interval H&P Note (Signed)
History and Physical Interval Note:  09/26/2012 12:58 PM  [name] [name]  has presented today for surgery, with the diagnosis of Abn chest x-ray  The various methods of treatment have been discussed with the patient and family. After consideration of risks, benefits and other options for treatment, the patient has consented to  Procedure(s) (LRB) with comments: VIDEO BRONCHOSCOPY WITH FLUORO (Bilateral) as a surgical intervention .  The patient's history has been reviewed, patient examined, no change in status, stable for surgery.  I have reviewed the patient's chart and labs.  Questions were answered to the patient's satisfaction.     ALVA,RAKESH V.

## 2012-09-27 ENCOUNTER — Encounter (HOSPITAL_COMMUNITY): Payer: Self-pay | Admitting: Pharmacy Technician

## 2012-09-27 ENCOUNTER — Encounter (HOSPITAL_COMMUNITY): Payer: Self-pay | Admitting: Pulmonary Disease

## 2012-09-27 DIAGNOSIS — I2510 Atherosclerotic heart disease of native coronary artery without angina pectoris: Secondary | ICD-10-CM | POA: Diagnosis present

## 2012-09-27 DIAGNOSIS — R9431 Abnormal electrocardiogram [ECG] [EKG]: Secondary | ICD-10-CM

## 2012-09-27 NOTE — Progress Notes (Signed)
Notified Cardiologist, Pulmonologist, and attending MD of patient leaving against medical advice. Patient understands the risk involved in leaving the hospital and also explained that his health insurance will not pay for hospital stay. Cardiologist advised nurse to tell patient that they will call him in regards to a holter monitor for patient stated he refuses to go through with the procedure for loop monitor. Prescription given to patient for Levaquin per pulmonologist and patient also states that he has set a follow-up  appt with him as well. After prescription given, patient and family left AMA. Nurse signing off at this time.

## 2012-09-27 NOTE — Progress Notes (Signed)
Subjective:  No syncope or pre syncope  Objective:  Vital Signs in the last 24 hours: Temp:  [97.9 F (36.6 C)-98 F (36.7 C)] 98 F (36.7 C) (11/22 0536) Pulse Rate:  [66-67] 67  (11/22 0536) Resp:  [10-28] 20  (11/22 0536) BP: (96-127)/(49-102) 96/49 mmHg (11/22 0536) SpO2:  [87 %-99 %] 97 % (11/22 0536) Weight:  [94.394 kg (208 lb 1.6 oz)] 94.394 kg (208 lb 1.6 oz) (11/22 0536)  Intake/Output from previous day:  Intake/Output Summary (Last 24 hours) at 09/27/12 0939 Last data filed at 09/27/12 0848  Gross per 24 hour  Intake    883 ml  Output   1350 ml  Net   -467 ml    Physical Exam: General appearance: alert, cooperative and no distress Lungs: diffuse wheezing and rhonchi Heart: regular rate and rhythm   Rate: 66  Rhythm: normal sinus rhythm and no arrythmia noted  Lab Results:  Basename 09/26/12 0019 09/25/12 1701  WBC 7.5 7.9  HGB 17.8* 17.9*  PLT 171 183    Basename 09/26/12 0019 09/25/12 1701  NA 144 140  K 4.7 4.5  CL 105 101  CO2 30 30  GLUCOSE 119* 80  BUN 11 12  CREATININE 1.13 1.01    Basename 09/26/12 1525 09/26/12 0718  TROPONINI <0.30 <0.30   Hepatic Function Panel  Basename 09/26/12 0019  PROT 7.0  ALBUMIN 3.8  AST 18  ALT 14  ALKPHOS 84  BILITOT 0.4  BILIDIR --  IBILI --   No results found for this basename: CHOL in the last 72 hours  Basename 09/26/12 0735  INR 0.99    Imaging: Imaging results have been reviewed  Cardiac Studies:  Assessment/Plan:   Principal Problem:  *Syncope, history consistent with arrythmia  NL LVF by echo  Active Problems:  Abnormal chest CT- suspected acute pulmonary process secondary to immunosuppression from Enbrel  Smoker  Abnormal EKG, LVH with repol  Psoriasis, on Enbrel prior to admission  LAD Ca++ noted on CT scan  Plan- Per Dr Royann Shivers- telemetry, GXT Myoview when able from pulmonary standpoint.  OP Monitor or Loop recorder.   Corine Shelter PA-C 09/27/2012, 9:39  AM   Patient seen and examined. Agree with assessment and plan. No dizziness or chest pain.  QTc is normal.  Will try to arrange for outpatient monitoring and subsequent loop recorder implantation next week.   Lennette Bihari, MD, [facility] 09/27/2012 10:48 AM

## 2012-09-27 NOTE — Progress Notes (Signed)
[facility]             Progress Note   Subjective: Feels fine, no symptoms, anxious to go home  Objective: Vital signs in last 24 hours: Temp:  [97.9 F (36.6 C)-98 F (36.7 C)] 98 F (36.7 C) (11/22 0536) Pulse Rate:  [66-67] 67  (11/22 0536) Resp:  [10-28] 20  (11/22 0536) BP: (96-127)/(49-102) 96/49 mmHg (11/22 0536) SpO2:  [87 %-99 %] 97 % (11/22 0536) Weight:  [94.394 kg (208 lb 1.6 oz)] 94.394 kg (208 lb 1.6 oz) (11/22 0536) Weight change: -2.449 kg (-5 lb 6.4 oz) Last BM Date: 09/26/12  Intake/Output from previous day: 11/21 0701 - 11/22 0700 In: 643 [P.O.:540; I.V.:3; IV Piggyback:100] Out: 750 [Urine:750] Total I/O In: 240 [P.O.:240] Out: 1100 [Urine:1100]   Physical Exam: General: Alert, awake, oriented x3, in no acute distress. HEENT: No bruits, no goiter. Heart: Regular rate and rhythm, without murmurs, rubs, gallops. Lungs: Clear to auscultation bilaterally. Abdomen: Soft, nontender, nondistended, positive bowel sounds. Extremities: No clubbing cyanosis or edema with positive pedal pulses. Neuro: Grossly intact, nonfocal.    Lab Results: Basic Metabolic Panel:  Basename 09/26/12 0019 09/25/12 1701  NA 144 140  K 4.7 4.5  CL 105 101  CO2 30 30  GLUCOSE 119* 80  BUN 11 12  CREATININE 1.13 1.01  CALCIUM 10.0 9.8  MG 2.3 --  PHOS 4.2 --   Liver Function Tests:  Basename 09/26/12 0019  AST 18  ALT 14  ALKPHOS 84  BILITOT 0.4  PROT 7.0  ALBUMIN 3.8   No results found for this basename: LIPASE:2,AMYLASE:2 in the last 72 hours No results found for this basename: AMMONIA:2 in the last 72 hours CBC:  Basename 09/26/12 0019 09/25/12 1701  WBC 7.5 7.9  NEUTROABS -- 5.0  HGB 17.8* 17.9*  HCT 50.5 51.9  MCV 89.7 90.9  PLT 171 183   Cardiac Enzymes:  Basename 09/26/12 1525 09/26/12 0718 09/26/12 0017  CKTOTAL -- -- --  CKMB -- -- --  CKMBINDEX -- -- --  TROPONINI <0.30 <0.30 <0.30   BNP:  Basename 09/26/12 1525    PROBNP 14.6   D-Dimer: No results found for this basename: DDIMER:2 in the last 72 hours CBG: No results found for this basename: GLUCAP:6 in the last 72 hours Hemoglobin A1C: No results found for this basename: HGBA1C in the last 72 hours Fasting Lipid Panel: No results found for this basename: CHOL,HDL,LDLCALC,TRIG,CHOLHDL,LDLDIRECT in the last 72 hours Thyroid Function Tests:  Basename 09/26/12 0019  TSH 3.153  T4TOTAL --  FREET4 --  T3FREE --  THYROIDAB --   Anemia Panel: No results found for this basename: VITAMINB12,FOLATE,FERRITIN,TIBC,IRON,RETICCTPCT in the last 72 hours Coagulation:  Basename 09/26/12 0735  LABPROT 13.0  INR 0.99   Urine Drug Screen: Drugs of Abuse  No results found for this basename: labopia, cocainscrnur, labbenz, amphetmu, thcu, labbarb    Alcohol Level: No results found for this basename: ETH:2 in the last 72 hours Urinalysis: No results found for this basename: COLORURINE:2,APPERANCEUR:2,LABSPEC:2,PHURINE:2,GLUCOSEU:2,HGBUR:2,BILIRUBINUR:2,KETONESUR:2,PROTEINUR:2,UROBILINOGEN:2,NITRITE:2,LEUKOCYTESUR:2 in the last 72 hours Misc. Labs:  Recent Results (from the past 240 hour(s))  CULTURE, BAL-QUANTITATIVE     Status: Normal (Preliminary result)   Collection Time   09/26/12  1:02 PM      Component Value Range Status Comment   Specimen Description BRONCHIAL ALVEOLAR LAVAGE   Final    Special Requests NONE   Final    Gram Stain     Final  Value: RARE WBC PRESENT, PREDOMINANTLY MONONUCLEAR     FEW SQUAMOUS EPITHELIAL CELLS PRESENT     FEW GRAM POSITIVE COCCI IN CHAINS     IN CLUSTERS IN PAIRS   Colony Count PENDING   Incomplete    Culture Culture reincubated for better growth   Final    Report Status PENDING   Incomplete   FUNGUS CULTURE W SMEAR     Status: Normal (Preliminary result)   Collection Time   09/26/12  1:02 PM      Component Value Range Status Comment   Specimen Description BRONCHIAL ALVEOLAR LAVAGE   Final    Special  Requests NONE   Final    Fungal Smear NO YEAST OR FUNGAL ELEMENTS SEEN   Final    Culture CULTURE IN PROGRESS FOR FOUR WEEKS   Final    Report Status PENDING   Incomplete   LEGIONELLA CULTURE     Status: Normal (Preliminary result)   Collection Time   09/26/12  1:02 PM      Component Value Range Status Comment   Specimen Description BRONCHIAL ALVEOLAR LAVAGE   Final    Special Requests NONE   Final    Culture     Final    Value: NO LEGIONELLA ISOLATED, CULTURE IN PROGRESS FOR 5 DAYS   Report Status PENDING   Incomplete     Studies/Results: Dg Chest 2 View  09/25/2012  *RADIOLOGY REPORT*  Clinical Data: Passed out work.  CHEST - 2 VIEW  Comparison: None.  Findings: Two-view exam shows diffuse interstitial and associated patchy bilateral alveolar opacities with relative sparing of the bases and apices.  Prominence of the main pulmonary arteries raises the question of pulmonary arterial hypertension. The cardiopericardial silhouette is within normal limits for size. Imaged bony structures of the thorax are intact.  IMPRESSION: Diffuse symmetric interstitial and alveolar disease involving the central lungs without cardiomegaly or Kerley B lines. Imaging features may be related to an infectious or inflammatory process. Pulmonary hemorrhage is not completely excluded.  Prominence of the main pulmonary arteries.   Original Report Authenticated By: Kennith Center, M.D.    Ct Head Wo Contrast  09/25/2012  *RADIOLOGY REPORT*  Clinical Data: Syncope  CT HEAD WITHOUT CONTRAST  Technique:  Contiguous axial images were obtained from the base of the skull through the vertex without contrast.  Comparison: None.  Findings: There is no evidence for acute hemorrhage, hydrocephalus, mass lesion, or abnormal extra-axial fluid collection.  No definite CT evidence for acute infarction.  The visualized paranasal sinuses and mastoid air cells are predominately clear.  IMPRESSION: No CT evidence for acute intracranial  abnormality.   Original Report Authenticated By: Jearld Lesch, M.D.    Ct Angio Chest W/cm &/or Wo Cm  09/25/2012  *RADIOLOGY REPORT*  Clinical Data: Palpitations.  Loss of consciousness.  Short of breath.  CT ANGIOGRAPHY CHEST  Technique:  Multidetector CT imaging of the chest using the standard protocol during bolus administration of intravenous contrast. Multiplanar reconstructed images including MIPs were obtained and reviewed to evaluate the vascular anatomy.  Contrast: OMNIPAQUE IOHEXOL 350 MG/ML SOLN  Comparison: None.  Findings: There are no filling defects in the pulmonary arterial tree to suggest acute pulmonary thromboembolism.  Mediastinal and hilar adenopathy are present.  14 mm right paratracheal node on image 29.  9 mm right paratracheal node on image 22.  1.7 cm subcarinal node on image 41.  2.1 cm right hilar node on image 38.  1.7  cm left hilar node on image 41.  All of the nodal tissue contains calcifications.  No pericardial effusion.  No pneumothorax.  No pleural effusion.  Lungs are abnormal.  There are heterogeneous opacities throughout both lungs with a central and upper lobe distribution.  This is characterized by ground-glass opacities, ill-defined pulmonary opacities, and ground-glass opacities.  This is associated with bilateral apical bronchiectasis.  The largest focal opacity in the right upper lobe on image 30 measures 1.7 x 1.4 cm.  There is no subpleural nodularity or nodularity within the airways.  No destructive bone lesion.  No obvious aortic dissection.  Atherosclerotic calcifications of the LAD are noted.  Normal thyroid gland.  IMPRESSION: No evidence of acute pulmonary thromboembolism.  The constellation of findings include mediastinal and hilar adenopathy containing calcifications associated with heterogeneous bilateral upper lobe opacities and bronchiectasis.  Allergic bronchopulmonary aspergillosis can cause bilateral upper lobe bronchiectasis and the  remainder of the findings can be related to fungal infection. Also consider mycobacterium infection. Calcified mediastinal nodes can be seen with treated lymphoma, sarcoidosis, or pneumoconiosis.   Original Report Authenticated By: Jolaine Click, M.D.    Dg Chest Port 1 View  09/26/2012  *RADIOLOGY REPORT*  Clinical Data: Post bronchoscopy  PORTABLE CHEST - 1 VIEW  Comparison: 09/25/2012  Findings: Diffuse bilateral   infiltrates are unchanged.  Negative for pneumothorax or pleural effusion.  IMPRESSION: No change in bilateral   infiltrates.  No pneumothorax post bronchoscopy.   Original Report Authenticated By: Janeece Riggers, M.D.    Dg C-arm Bronchoscopy  09/26/2012  CLINICAL DATA: bronch   C-ARM BRONCHOSCOPY  Fluoroscopy was utilized by the requesting physician.  No radiographic  interpretation.      Medications: Scheduled Meds:   . aspirin EC  81 mg Oral Daily  . butamben-tetracaine-benzocaine  1 spray Topical Once  . docusate sodium  100 mg Oral BID  . enoxaparin (LOVENOX) injection  40 mg Subcutaneous Q24H  . levofloxacin (LEVAQUIN) IV  500 mg Intravenous Q24H  . lidocaine   Topical Once  . sodium chloride  3 mL Intravenous Q12H   Continuous Infusions:   . [EXPIRED] sodium chloride 75 mL/hr at 09/26/12 0409  . sodium chloride 10 mL/hr (09/26/12 1150)   PRN Meds:.acetaminophen, acetaminophen, albuterol, fentaNYL, HYDROcodone-acetaminophen, lidocaine, lidocaine, midazolam, [EXPIRED] ondansetron (ZOFRAN) IV, ondansetron (ZOFRAN) IV, ondansetron, phenylephrine, phenylephrine  Assessment/Plan:   1. Syncope, history consistant with arrythmia, (NL LVF on echo) ECHO, TSH, tele unremarkable so far Appreciate Cardiology assistance,  Plan for event monitor today or loop recorder early next week per Cards  2.  Abnormal chest CT, suspected sub-acute pulmonary process secondary to immunosuppression from Enbrel H/o multiple negative PPDs, HIV negative S/p bronch and BAL with washings and  transbronchial biopsy yesterday FU with Dr.ALva next week to FU biopsy results  3. Psoriasis, on Enbrel prior to admission  4. Possible  CAD, incidental LAD calcification noted on CT scan Plan for myoview as outpatient per cards  Prophylaxis: Lovenox, Protonix   CODE STATUS: FULL CODE  Family communication: discussed with pt at bedside Disposition:home pending event vs loop recorder    Time spent coordinating care:   LOS: 2 days   [facility] Pager: [phone] 09/27/2012, 10:23 AM

## 2012-09-27 NOTE — Progress Notes (Signed)
Name: [name] [name] MRN: [id number] DOB: 12-06-1960  LOS: 2  LB PCCM  History of Present Illness: This is a very pleasant 51 year old male with a past medical history significant for psoriasis treated with Enbrel who was admitted to [facility] on 09/25/2012 for syncope. Pulmonary critical care medicine was consulted for a markedly abnormal CT chest performed as part of the workup for syncope.  This gentleman no significant past respiratory illnesses but about 2 months ago he states that he developed fever, chills, shortness of breath, fatigue, and cough productive of yellow sputum. After 2 days of symptoms he sought care with his primary care physician in [facility] and was given a Z-Pak. On day 5 of the Z-Pak his symptoms resolved and have not recurred. At that time he said he was noted to have wheezing which he has never been told in the past.  He came to the emergency room on 09/25/2012 for 3 separate episodes of syncope. These have occurred at work either while sitting or standing and are not associated with chest pain.  He has been treated with Enbrel for the last 5 years for psoriasis. He has had PPDs placed every 6 months and they have always been negative. He works at [employer] in [employer] and he has not had heavy dust exposure there. He has been helping a friend with some woodworking (cedar) for a few days in the last 2 months. In the last 2 months he has gotten a new dog and he has had dogs over the years. He does not have mold or mildew in his living space nor does he have a hot tub or humidifier.  And he has not had cough or sputum production since being treated with antibiotics over one month ago. He has lost 30 pounds with diet and exercise in the last several months. He denies night sweats or unexplained fevers or chills.   Lines / Drains: Peripheral IV   Antibiotics: None  Tests / Events: 09/25/2012 CT angio chest>> there is no pulmonary embolism;  there is mediastinal and hilar calcified lymphadenopathy; windows of the lung parenchyma reveal an upper lobe predominant nodular, micronodular and groundglass process    SUBJ - denies CP, dyspnea, hemoptysis Vital Signs:   Filed Vitals:   09/26/12 1343 09/26/12 1405 09/26/12 2059 09/27/12 0536  BP: 104/59 106/64 108/73 96/49  Pulse:   66 67  Temp:   97.9 F (36.6 C) 98 F (36.7 C)  TempSrc:   Oral Oral  Resp: 25 20 18 20   Height:      Weight:    208 lb 1.6 oz (94.394 kg)  SpO2: 94% 94% 93% 97%    Physical Examination: Gen: well appearing, no acute distress HEENT: NCAT, PERRL, EOMi, OP clear,  Neck: supple without masses PULM: clear  CV: RRR, no mgr, no JVD AB: BS+, soft, nontender, no hsm Ext: warm, no edema, no clubbing, no cyanosis Derm: no rash or skin breakdown Neuro: A&Ox4, CN II-XII intact, strength 5/5 in all 4 extremities Psyche: Normal mood and affect  Labs and Imaging:   CBC    Component Value Date/Time   WBC 7.5 09/26/2012 0019   RBC 5.63 09/26/2012 0019   HGB 17.8* 09/26/2012 0019   HCT 50.5 09/26/2012 0019   PLT 171 09/26/2012 0019   MCV 89.7 09/26/2012 0019   MCH 31.6 09/26/2012 0019   MCHC 35.2 09/26/2012 0019   RDW 14.5 09/26/2012 0019   LYMPHSABS 2.0 09/25/2012 1701  MONOABS 0.7 09/25/2012 1701   EOSABS 0.1 09/25/2012 1701   BASOSABS 0.1 09/25/2012 1701    BMET    Component Value Date/Time   NA 144 09/26/2012 0019   K 4.7 09/26/2012 0019   CL 105 09/26/2012 0019   CO2 30 09/26/2012 0019   GLUCOSE 119* 09/26/2012 0019   BUN 11 09/26/2012 0019   CREATININE 1.13 09/26/2012 0019   CALCIUM 10.0 09/26/2012 0019   GFRNONAA 74* 09/26/2012 0019   GFRAA 86* 09/26/2012 0019    ABG No results found for this basename: phart,  pco2,  pco2art,  po2,  po2art,  hco3,  tco2,  acidbasedef,  o2sat   Impression: 1) Abnormal Chest CT 2) Psoriasis 3) Syncope 4) Immunocompromised state   Assessment and Plan:  This is a very pleasant 51 year old  male who has been on Enbrel for the last 5 years for psoriasis who was admitted on 09/25/2012 to [facility] for evaluation of syncope. He was found to have a markedly abnormal CT chest when the study was performed to look for pulmonary embolism. There is no PE but he has a nodular and groundglass process in the upper lobes of his lungs bilaterally. This is associated with a markedly abnormal respiratory exam he has basically no symptoms aside from a possible URI versus pneumonia about 2 months ago.  Bronchoscopy s/o infectious etio with mucoid sputum - levaquin added - would Rx for 10 ds while awaiting cx data Prelim is GPC pairs in sputum. HIV neg Echo & carotids nml - wu for syncope ongoing He wants to go home - I explained that we do not have confirmed diagnosis & treatent is empiric, but he prefers outpt FU. I have made OV FU for him next week. Would keep on levaquin . Low Wc & absence of hypoxia is reassuring.  Cyril Mourning MD. Tonny Bollman. [facility] Pager [phone] If no response call [phone]   09/27/2012, 1:26 PM

## 2012-09-27 NOTE — Progress Notes (Signed)
Unfortunately there are no monitors available at our office. Discussed with Dr Royann Shivers. We will keep him on telemetry as we feel he is high risk for another syncopal spell. The plan is for loop recorder implant Tuesday. I discussed this with Dr Jomarie Longs and the pt and all are in agreement.   Corine Shelter PA-C 09/27/2012 11:57 AM

## 2012-09-27 NOTE — Progress Notes (Signed)
Mr Mara has indicated he does not want to stay in the hospital and does not want a loop recorder placed. We will contact him about getting an OP Event Monitor placed. He has been advised that he should not drive or operate machinery till cleared by Dr Royann Shivers.  Corine Shelter PA-C 09/27/2012 3:59 PM

## 2012-09-30 ENCOUNTER — Encounter (HOSPITAL_COMMUNITY): Payer: Self-pay | Admitting: Pharmacy Technician

## 2012-09-30 LAB — CULTURE, BAL-QUANTITATIVE W GRAM STAIN: Colony Count: 30000

## 2012-10-01 ENCOUNTER — Ambulatory Visit (HOSPITAL_BASED_OUTPATIENT_CLINIC_OR_DEPARTMENT_OTHER)
Admission: RE | Admit: 2012-10-01 | Discharge: 2012-10-01 | Disposition: A | Discharge status: Home / Self Care | Payer: BC Managed Care – PPO | Source: Ambulatory Visit | Attending: Pulmonary Disease | Admitting: Pulmonary Disease

## 2012-10-01 ENCOUNTER — Encounter: Payer: Self-pay | Admitting: *Deleted

## 2012-10-01 ENCOUNTER — Encounter (HOSPITAL_COMMUNITY): Admission: RE | Payer: Self-pay | Source: Ambulatory Visit

## 2012-10-01 ENCOUNTER — Ambulatory Visit (HOSPITAL_COMMUNITY)
Admission: RE | Admit: 2012-10-01 | Payer: BC Managed Care – PPO | Source: Ambulatory Visit | Admitting: Cardiovascular Disease

## 2012-10-01 ENCOUNTER — Encounter: Payer: Self-pay | Admitting: Pulmonary Disease

## 2012-10-01 ENCOUNTER — Ambulatory Visit (INDEPENDENT_AMBULATORY_CARE_PROVIDER_SITE_OTHER): Payer: BC Managed Care – PPO | Admitting: Pulmonary Disease

## 2012-10-01 VITALS — BP 112/84 | HR 86 | Temp 98.2°F | Ht 75.0 in | Wt 209.0 lb

## 2012-10-01 DIAGNOSIS — R9389 Abnormal findings on diagnostic imaging of other specified body structures: Secondary | ICD-10-CM | POA: Insufficient documentation

## 2012-10-01 LAB — LEGIONELLA CULTURE

## 2012-10-01 LAB — HISTOPLASMA ANTIGEN, URINE: Histoplasma Antigen, urine: <0.5 ng/mL

## 2012-10-01 SURGERY — LOOP RECORDER IMPLANT
Anesthesia: LOCAL

## 2012-10-01 NOTE — Progress Notes (Signed)
Subjective:    Patient ID: [name] [name], male    DOB: 01-04-1961, 51 y.o.   MRN: [id number]  HPI 51 year old never smoker, [employer] employee with a past medical history significant for psoriasis treated with Enbrel who was admitted to [facility] on 09/25/2012 for syncope. Pulmonary critical care medicine was consulted for a markedly abnormal CT chest performed as part of the workup for syncope.  This gentleman no significant past respiratory illnesses but about 2 months ago he states that he developed fever, chills, shortness of breath, fatigue, and cough productive of yellow sputum. After 2 days of symptoms he sought care with his primary care physician in [facility] and was given a Z-Pak. On day 5 of the Z-Pak his symptoms resolved and have not recurred. At that time he said he was noted to have wheezing which he has never been told in the past.  He came to the emergency room on 09/25/2012 for 3 separate episodes of syncope- lasting a few seconds each time. These have occurred at work either while sitting or standing and are not associated with chest pain.  He has been treated with Enbrel for the last 5 years for psoriasis. He has had PPDs placed every 6 months and they have always been negative. He works at [employer] in [employer] and he has not had heavy dust exposure there. He has been helping a friend with some woodworking (cedar) for a few days in the last 2 months. In the last 2 months he has gotten a new dog and he has had dogs over the years. He does not have mold or mildew in his living space nor does he have a hot tub or humidifier.  And he has not had cough or sputum production since being treated with antibiotics over one month ago. He has lost 30 pounds with diet and exercise in the last several months. He denies night sweats or unexplained fevers or chills.    Bronchoscopy s/o infectious etio with mucoid sputum - levaquin added  Resp cx - MSSA. HIV neg  Echo &  carotids nml - wu for syncope ongoing  Low Wc & absence of hypoxia was reassuring. He signed out AMA & did not want implantable loop recorder . CXR 11/26 shows unchanged BL infiltrates. He is feeling much better, no fevers  Or cough, breathing better Took flu shot at [employer]  Past Medical History  Diagnosis Date  . Complication of anesthesia 1985    "took me 2 days 2 come out of it; doesn't take much to put me to sleep" (09/26/2012)  . Arthritis     "ankles and wrists" (09/26/2012)  . Psoriasis     "legs and elbows" (09/26/2012)  . Syncope and collapse     "3 rimes in the last 2 wk"(09/26/2012)     Review of Systems neg for any significant sore throat, dysphagia, itching, sneezing, nasal congestion or excess/ purulent secretions, fever, chills, sweats, unintended wt loss, pleuritic or exertional cp, hempoptysis, orthopnea pnd or change in chronic leg swelling. Also denies presyncope, palpitations, heartburn, abdominal pain, nausea, vomiting, diarrhea or change in bowel or urinary habits, dysuria,hematuria, rash, arthralgias, visual complaints, headache, numbness weakness or ataxia.     Objective:   Physical Exam  Gen. Pleasant, well-nourished, in no distress ENT - no lesions, no post nasal drip Neck: No JVD, no thyromegaly, no carotid bruits Lungs: no use of accessory muscles, no dullness to percussion, coarse without rales or rhonchi  Cardiovascular: Rhythm regular, heart sounds  normal, no murmurs or gallops, no peripheral edema Musculoskeletal: No deformities, no cyanosis or clubbing         Assessment & Plan:

## 2012-10-01 NOTE — H&P (Signed)
  Date of Initial H&P: 09/26/2012  History reviewed, patient examined, no change in status, stable for surgery. Here for implantable loop recorder placement for recurrent unexplained syncope.Thurmon Fair, MD, [facility] [phone] office [phone] pager 10/01/2012

## 2012-10-01 NOTE — Patient Instructions (Addendum)
I favor staph infection in your lungs from aspiration OR this could be early fibrosis (doubt) Complete antibiotic course Your heart doctor's name is Dr Royann Shivers - [facility] - contact his office for event recorder placement.

## 2012-10-03 LAB — HYPERSENSITIVITY PNUEMONITIS PROFILE

## 2012-10-17 NOTE — Discharge Summary (Addendum)
Physician Discharge Summary  Patient ID: WINSTON MISNER MRN: [id number] DOB/AGE: 07-29-1961 51 y.o.  Admit date: 09/25/2012 Discharge date: 10/17/2012  Primary Care Physician:  Patrica Duel, PA  LEFT AMA   Discharge Diagnoses:    Principal Problem:  *Syncope, history consistant with arrythmia, (NL LVF on echo) Active Problems:  Abnormal chest CT, suspected acute pulmonary process secondary to immunosuppression from Enbrel  Smoker  Abnormal EKG, LVH with repol  Psoriasis, on Enbrel prior to admission  CAD, incidental LAD calcification noted on CT scan      Medication List     As of 10/17/2012 10:24 PM       Disposition and Follow-up:  Dr.Alva Pulmonary Dr.Croitoru, [facility]  Consults:  Dr.Alva Pulmonary Dr.Croitoru, [facility]  Significant Diagnostic Studies:  No results found.  Brief H and P: BRIGHT SPIELMANN is a 51 y.o. male has no past medical history on file.  Presented with  Today at around 3:30 pm he started to feel that his heart was racing he felt light headed and set on the floor and then lost conciseness for few seconds. Laying down helped immediately. He had prior episodes. Once when he was standing up he synopsized without warning. Last week he synopsized while driving with presyncopal prodrome.  NO shortness of breath no chest pain. Denies any cough, fever , chills or pulmonary complaints. He have had negative PPD done prior to initiation of enbrel and every 6 months there after.  No known exposure to Tb and no risk factors.  CT showed possible pulmonary infection AFB vs aspergillosis vs nocardia on differential    Hospital Course:  1. Syncope, history consistant with arrythmia, (NL LVF on echo)  ECHO, TSH, tele unremarkable so far  Appreciate Cardiology assistance,  Plan for event monitor today if becomes available or loop recorder early next week per Cards, But patient was anxious to leave and unwilling to wait for loop recorder on Tuesday, and hence  left AMA 2. Abnormal chest CT, suspected sub-acute pulmonary process secondary to immunosuppression from Enbrel  H/o multiple negative PPDs, HIV negative  S/p bronch and BAL with washings and transbronchial biopsy yesterday  FU with Dr.ALva next week to FU biopsy results  3. Psoriasis, on Enbrel prior to admission  4. Possible CAD, incidental LAD calcification noted on CT scan  Plan for [facility] as outpatient per cards    Time spent on Discharge: 0  Signed: Kathia Covington [facility]  10/17/2012, 10:24 PM

## 2012-10-22 ENCOUNTER — Ambulatory Visit: Payer: BC Managed Care – PPO | Admitting: Pulmonary Disease

## 2012-10-23 LAB — FUNGUS CULTURE W SMEAR

## 2012-11-08 LAB — AFB CULTURE WITH SMEAR (NOT AT ARMC): Acid Fast Smear: NONE SEEN

## 2013-08-15 ENCOUNTER — Ambulatory Visit (INDEPENDENT_AMBULATORY_CARE_PROVIDER_SITE_OTHER): Payer: BLUE CROSS/BLUE SHIELD | Admitting: Family Medicine

## 2013-08-15 ENCOUNTER — Encounter: Payer: Self-pay | Admitting: Family Medicine

## 2013-08-15 VITALS — BP 124/84 | HR 72 | Temp 98.0°F | Resp 18 | Ht 75.0 in | Wt 234.0 lb

## 2013-08-15 DIAGNOSIS — B349 Viral infection, unspecified: Secondary | ICD-10-CM

## 2013-08-15 DIAGNOSIS — R062 Wheezing: Secondary | ICD-10-CM

## 2013-08-15 DIAGNOSIS — B9789 Other viral agents as the cause of diseases classified elsewhere: Secondary | ICD-10-CM

## 2013-08-15 MED ORDER — GUAIFENESIN-CODEINE 100-10 MG/5ML PO SOLN: 10.0000 mL | Freq: Three times a day (TID) | ORAL | Status: DC | PRN

## 2013-08-15 MED ORDER — ALBUTEROL SULFATE HFA 108 (90 BASE) MCG/ACT IN AERS: 2.0000 | INHALATION_SPRAY | Freq: Four times a day (QID) | RESPIRATORY_TRACT | Status: DC | PRN

## 2013-08-15 NOTE — Patient Instructions (Signed)
Viral illness-  Continue to push fluids Use the albuterol inhaler as needed Take the cough medication F/U as needed

## 2013-08-17 DIAGNOSIS — B349 Viral infection, unspecified: Secondary | ICD-10-CM | POA: Insufficient documentation

## 2013-08-17 DIAGNOSIS — R062 Wheezing: Secondary | ICD-10-CM | POA: Insufficient documentation

## 2013-08-17 NOTE — Progress Notes (Signed)
  Subjective:    Patient ID: [name] [name], male    DOB: 07/22/1961, 52 y.o.   MRN: [id number]  HPI  Pt here with cough- dry, sinus pressure congestion, sore throat, feeling achy for past 3 days. States received flu shot on Wed. His roommate has been sick all week with same symptoms. Low grade fever last night. Took some OTC Dayquil which helped.  Had diarrhea yesterday, non today, had post-tussive emesis x 1.   Review of Systems  GEN- + fatigue, fever, weight loss,weakness, recent illness HEENT- denies eye drainage, change in vision, +nasal discharge, CVS- denies chest pain, palpitations RESP- denies SOB, +cough, wheeze ABD- denies N/V, change in stools, abd pain GU- denies dysuria, hematuria, dribbling, incontinence MSK- denies joint pain, +muscle aches, injury Neuro- denies headache, dizziness, syncope, seizure activity      Objective:   Physical Exam  GEN- NAD, alert and oriented x3 HEENT- PERRL, EOMI, non injected sclera, pink conjunctiva, MMM, oropharynx mild injection, TM clear bilat no effusion, no  maxillary sinus tenderness,  Neck- Supple, no LAD CVS- RRR, no murmur RESP-bilteral wheeze expiratory, normal WOB, no rhonchi, no rales ABD-NABS,soft,NT,ND EXT- No edema Pulses- Radial 2+        Assessment & Plan:

## 2013-08-17 NOTE — Assessment & Plan Note (Addendum)
Supportive care, push fluids Cough medicine given Doubt related to flu shot He has chronic wheezing, that has been worked up per pt, trial of albuterol as worse with viral illness.

## 2013-08-19 ENCOUNTER — Encounter: Payer: Self-pay | Admitting: Family Medicine

## 2013-08-19 ENCOUNTER — Ambulatory Visit (INDEPENDENT_AMBULATORY_CARE_PROVIDER_SITE_OTHER): Payer: BLUE CROSS/BLUE SHIELD | Admitting: Family Medicine

## 2013-08-19 VITALS — BP 118/78 | HR 68 | Temp 98.2°F | Resp 18 | Wt 230.0 lb

## 2013-08-19 DIAGNOSIS — B349 Viral infection, unspecified: Secondary | ICD-10-CM

## 2013-08-19 DIAGNOSIS — J209 Acute bronchitis, unspecified: Secondary | ICD-10-CM

## 2013-08-19 DIAGNOSIS — B9789 Other viral agents as the cause of diseases classified elsewhere: Secondary | ICD-10-CM

## 2013-08-19 MED ORDER — AZITHROMYCIN 250 MG PO TABS: ORAL_TABLET | ORAL | Status: DC

## 2013-08-19 NOTE — Assessment & Plan Note (Addendum)
His respiratory component has worsened but albuterol is helping Will add zpak to cover for any bacterial component, mucinex Continue albuterol

## 2013-08-19 NOTE — Patient Instructions (Signed)
Get imodium Start antibiotics Continue albuterol You can also get mucinex for congestion

## 2013-08-19 NOTE — Progress Notes (Signed)
  Subjective:    Patient ID: [name] [name], male    DOB: 26-Sep-1961, 52 y.o.   MRN: [id number]  HPI  Pt here with worsening symptoms,productive cough and some wheeze over the weekend.  no fever. ALbuterol helps a lot to open him up. Cough medicine did not help. Continues to have body aches. Had diarrhea yesterday 3-4 times, NB, no abd pain. Decreased appetite. Had to leave work early due to symptoms and fatigue.    Review of Systems  GEN- + fatigue, fever, weight loss,weakness, recent illness HEENT- denies eye drainage, change in vision, +nasal discharge, CVS- denies chest pain, palpitations RESP- denies SOB,+ cough,+ wheeze ABD- denies N/V, +change in stools, abd pain MSK- denies joint pain, +muscle aches, injury Neuro- denies headache, dizziness, syncope, seizure activity      Objective:   Physical Exam GEN- NAD, alert and oriented x3 HEENT- PERRL, EOMI, non injected sclera, pink conjunctiva, MMM, oropharynx +injection, TM clear bilat no effusion, no  maxillary sinus tenderness,  Neck- Supple, no LAD CVS- RRR, no murmur RESP-no wheeze , normal WOB, +mild rhonchi, no rales ABD-NABS,soft,NT,ND EXT- No edema Pulses- Radial 2+       Assessment & Plan:

## 2013-08-19 NOTE — Assessment & Plan Note (Signed)
Continue with supportive care, add imodium for the diarrhea Push fluids Rest, given note for work

## 2014-05-20 ENCOUNTER — Encounter: Payer: Self-pay | Admitting: Physician Assistant

## 2014-05-20 ENCOUNTER — Ambulatory Visit (INDEPENDENT_AMBULATORY_CARE_PROVIDER_SITE_OTHER): Payer: BLUE CROSS/BLUE SHIELD | Admitting: Physician Assistant

## 2014-05-20 VITALS — BP 120/64 | HR 78 | Temp 98.4°F | Resp 20 | Ht 75.0 in | Wt 223.0 lb

## 2014-05-20 DIAGNOSIS — M545 Low back pain, unspecified: Secondary | ICD-10-CM

## 2014-05-20 MED ORDER — METAXALONE 800 MG PO TABS: 800.0000 mg | ORAL_TABLET | Freq: Four times a day (QID) | ORAL | Status: DC

## 2014-05-20 NOTE — Progress Notes (Signed)
Patient ID: [name] [name] MRN: [id number], DOB: 1961/10/20, 53 y.o. Date of Encounter: 05/20/2014, 12:57 PM    Chief Complaint:  Chief Complaint  Patient presents with  . Back pain x 2.5 weeks - getting worse     HPI: 53 y.o. year old white male reports that he has no prior history of any significant back pain. Has never had to seek any medical evaluation for back pain.  Says he works for [employer]. 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  Says that this problem started about 2 weeks ago and the first time he had any problem with this was one morning when he woke up and tried to get out of bed. At that time he developed spasm / "a catch" in his low back.  Since then every morning he has to roll over and use his arms to push  himself up out of bed. Also every morning when he goes to brush his teeth, when he bends at that angle, that causes spasm in his low back. He has learned to use his arm on the counter top as a brace to support himself so  he does not bend it at that angle.  The past 2 weeks he has been getting coworkers to help him so that he can avoid doing any lifting at work.  Also says that getting in and out of his truck also causes him to develop this  "catch" so rather than just stepping out of his truck as usual he has to turn his entire body and then step out or else it causes that same "catch". Points to midline low back at about L5 level as the area of spasm. Has some radiation of the discomfort down to bilateral buttocks. No pain numbness or tingling or weakness down either leg. No cauda equina symptoms.     Home Meds:   Outpatient Prescriptions Prior to Visit  Medication Sig Dispense Refill  . albuterol (PROVENTIL HFA;VENTOLIN HFA) 108 (90 BASE) MCG/ACT inhaler Inhale 2 puffs into the lungs every 6 (six) hours as needed for wheezing.  1 Inhaler  0  . azithromycin (ZITHROMAX) 250 MG tablet Take 2 tablets x 1 days,  then 1 tablet daily for 4 days  6 tablet  0  . guaiFENesin-codeine 100-10 MG/5ML syrup Take 10 mLs by mouth 3 (three) times daily as needed for cough.  180 mL  0   No facility-administered medications prior to visit.    Allergies: No Known Allergies    Review of Systems: See HPI for pertinent ROS. All other ROS negative.    Physical Exam: Blood pressure 120/64, pulse 78, temperature 98.4 F (36.9 C), temperature source Oral, resp. rate 20, height 6\' 3"  (1.905 m), weight 223 lb (101.152 kg)., Body mass index is 27.87 kg/(m^2). General: [name].  Appears in no acute distress. Lungs: Clear bilaterally to auscultation without wheezes, rales, or rhonchi. Breathing is unlabored. Heart: Regular rhythm. No murmurs, rubs, or gallops. Msk:  Strength and tone normal for age. Back: points to midline, approx L5 level as area of spasm. No tenderness with palpation of the sciatic notches bilaterally. Bilateral straight leg raise and hip adduction are intact. 2 + equal,symetrical patella reflexes bilaterally. 5/5 strength of bilateral lower extremities.  Extremities/Skin: Warm and dry. Neuro: Alert and oriented X 3. Moves all extremities spontaneously. Gait is normal. CNII-XII grossly in tact. Psych:  Responds to questions appropriately with a normal affect.  ASSESSMENT AND PLAN:  53 y.o. year old male with  1. Midline low back pain without sciatica - metaxalone (SKELAXIN) 800 MG tablet; Take 1 tablet (800 mg total) by mouth 4 (four) times daily.  Dispense: 60 tablet; Refill: 0  Discussed resting the muscles of the back. Gave him a note to turn into work for no lifting or bending for one week. Discussed with him to use the Skelaxin 4 times daily --unless --if it causes drowsiness-- then he will have to limit its use. Discussed using heat including a heating pad as well as warm water in the shower --to the area. Discussed stretching the low back. Once these current symptoms resolve he will  need to do exercises to strengthen his abdominal muscles and back muscles to prevent reoccurrence.  F/IU if the symptoms worsen or do not resolve over the next couple weeks.   [address] [place], [place], [facility] 05/20/2014 12:57 PM

## 2015-09-03 ENCOUNTER — Telehealth: Payer: Self-pay | Admitting: Orthopedic Surgery

## 2015-09-03 NOTE — Telephone Encounter (Signed)
Spoke to patient.  Has not been seen in over a year.  Really should be seen.  Pt was agreeable.  Appt for Monday made.

## 2015-09-03 NOTE — Telephone Encounter (Signed)
PHARMACY: [facility] [address]   MEDICATION: VALACYCLOVIR   QTY:    SIG:    PHYSICIAN: LAST SAW MARY BETH, WANTED TO SEE IF WE WOULD REFILL.   PT. PHONE #: [phone]

## 2015-09-06 ENCOUNTER — Encounter: Payer: Self-pay | Admitting: Physician Assistant

## 2015-09-06 ENCOUNTER — Ambulatory Visit (INDEPENDENT_AMBULATORY_CARE_PROVIDER_SITE_OTHER): Payer: Self-pay | Admitting: Physician Assistant

## 2015-09-06 ENCOUNTER — Ambulatory Visit
Admission: RE | Admit: 2015-09-06 | Discharge: 2015-09-06 | Disposition: A | Discharge status: Home / Self Care | Payer: PRIVATE HEALTH INSURANCE | Source: Ambulatory Visit | Attending: Physician Assistant | Admitting: Physician Assistant

## 2015-09-06 VITALS — BP 112/78 | HR 76 | Temp 98.2°F | Resp 18 | Ht 75.0 in | Wt 225.0 lb

## 2015-09-06 DIAGNOSIS — R9389 Abnormal findings on diagnostic imaging of other specified body structures: Secondary | ICD-10-CM

## 2015-09-06 DIAGNOSIS — Z720 Tobacco use: Secondary | ICD-10-CM

## 2015-09-06 DIAGNOSIS — R938 Abnormal findings on diagnostic imaging of other specified body structures: Secondary | ICD-10-CM

## 2015-09-06 DIAGNOSIS — F17200 Nicotine dependence, unspecified, uncomplicated: Secondary | ICD-10-CM

## 2015-09-06 DIAGNOSIS — R062 Wheezing: Secondary | ICD-10-CM

## 2015-09-06 DIAGNOSIS — B009 Herpesviral infection, unspecified: Secondary | ICD-10-CM | POA: Insufficient documentation

## 2015-09-06 DIAGNOSIS — J449 Chronic obstructive pulmonary disease, unspecified: Secondary | ICD-10-CM

## 2015-09-06 MED ORDER — ALBUTEROL SULFATE HFA 108 (90 BASE) MCG/ACT IN AERS: 2.0000 | INHALATION_SPRAY | Freq: Four times a day (QID) | RESPIRATORY_TRACT | Status: DC | PRN

## 2015-09-06 MED ORDER — VALACYCLOVIR HCL 1 G PO TABS
ORAL_TABLET | ORAL | Status: DC
Start: 2015-09-06 — End: 2017-09-03

## 2015-09-06 NOTE — Progress Notes (Signed)
Patient ID: [name] [name] MRN: [id number], DOB: [birth year], 54 y.o. Date of Encounter: @DATE @  Chief Complaint:  Chief Complaint  Patient presents with  . Medication Refill    has  not been seen in a long time    HPI: 54 y.o. year old white male  presents so that he can get refill on his medication.  Initially he states that he is just here to get refill on his Valtrex, which she just uses as needed for cold sores.  Later in the visit, he also says that he needs a refill on his inhaler. Asked how often he is needing his albuterol inhaler. He says that he uses it every night when he lays down to go to bed. This is the only time that he uses it. Says that other times while is at nighttime when he lays down he starts to cough. However, if he uses the albuterol inhaler then he does not have the coughing.  He has no other complaints or concerns today.   Past Medical History  Diagnosis Date  . Complication of anesthesia 1985    "took me 2 days 2 come out of it; doesn't take much to put me to sleep" (09/26/2012)  . Arthritis     "ankles and wrists" (09/26/2012)  . Psoriasis     "legs and elbows" (09/26/2012)  . Syncope and collapse     "3 rimes in the last 2 wk"(09/26/2012)     Home Meds: Outpatient Prescriptions Prior to Visit  Medication Sig Dispense Refill  . metaxalone (SKELAXIN) 800 MG tablet Take 1 tablet (800 mg total) by mouth 4 (four) times daily. (Patient not taking: Reported on 09/06/2015) 60 tablet 0  . naproxen sodium (ANAPROX) 220 MG tablet Take 220 mg by mouth 2 (two) times daily with a meal.    . albuterol (PROVENTIL HFA;VENTOLIN HFA) 108 (90 BASE) MCG/ACT inhaler Inhale 2 puffs into the lungs every 6 (six) hours as needed for wheezing. (Patient not taking: Reported on 09/06/2015) 1 Inhaler 0   No facility-administered medications prior to visit.    Allergies: No Known Allergies  Social History   Social History  . Marital Status: Single    Spouse Name:  N/A  . Number of Children: N/A  . Years of Education: N/A   Occupational History  . Not on file.   Social History Main Topics  . Smoking status: Former Smoker -- 0.50 packs/day for 15 years    Types: Cigarettes    Quit date: 09/25/2012  . Smokeless tobacco: Never Used  . Alcohol Use: Yes     Comment: 09/26/2012 "3 beers/yr at the most; last beer 7-8 yr ago"  . Drug Use: No  . Sexual Activity: No   Other Topics Concern  . Not on file   Social History Narrative    Family History  Problem Relation Age of Onset  . Pulmonary fibrosis Mother   . Diabetes type II Father   . Diabetes type II Brother   . Heart disease Other      Review of Systems:  See HPI for pertinent ROS. All other ROS negative.    Physical Exam: Blood pressure 112/78, pulse 76, temperature 98.2 F (36.8 C), temperature source Oral, resp. rate 18, height 6\' 3"  (1.905 m), weight 225 lb (102.059 kg)., Body mass index is 28.12 kg/(m^2). General: [name]. Appears in no acute distress. Neck: Supple. No thyromegaly. No lymphadenopathy. Lungs:  Coarse Breath sounds, heard more on inhalation  than exhalation. Heard throughout bilaterally, but more prominent in upper lungs.  Heart: RRR with S1 S2. No murmurs, rubs, or gallops. Musculoskeletal:  Strength and tone normal for age. Extremities/Skin: Warm and dry. Neuro: Alert and oriented X 3. Moves all extremities spontaneously. Gait is normal. CNII-XII grossly in tact. Psych:  Responds to questions appropriately with a normal affect.     ASSESSMENT AND PLAN:  54 y.o. year old male with  1. Herpes simplex type 1 infection - valACYclovir (VALTREX) 1000 MG tablet; Take 2 every 12 hours for 2 doses.  Dispense: 30 tablet; Refill: 5  2. Chronic obstructive pulmonary disease, unspecified COPD type (HCC) - albuterol (PROVENTIL HFA;VENTOLIN HFA) 108 (90 BASE) MCG/ACT inhaler; Inhale 2 puffs into the lungs every 6 (six) hours as needed for wheezing.  Dispense: 1 Inhaler;  Refill: 9 - DG Chest 2 View; Future  3. Abnormal chest CT, suspected acute pulmonary process secondary to immunosuppression from Enbrel - DG Chest 2 View; Future  4. Smoker He reports that he quit smoking 3 months ago. - DG Chest 2 View; Future  5. Wheezing - DG Chest 2 View; Future  I found the following information already in epic and his problem list: Abnormal chest CT, suspected acute pulmonary process secondary to immunosuppression from Enbrel  Wheezing--- there is a comment under this that has to do with him having evaluation with pulmonary and having a bronchoscopy  Discussed having him return to do complete physical exam. He is agreeable to do so. He states that he is out of work for the upcoming 2 weeks and can easily return fasting for complete physical exam within the next couple of weeks. At that time will also follow-up as far as getting records from pulmonary and further investigate findings of that prior evaluation and determine whether he needs further pulmonary evaluation at this time. He is agreeable to go ahead and go for chest x-ray so we'll go ahead and obtain this and then follow-up with him for complete physical exam in the next 1-2 weeks.    Murray Hodgkins [place], [place], [facility] 09/06/2015 3:27 PM

## 2015-09-09 ENCOUNTER — Other Ambulatory Visit: Payer: Self-pay | Admitting: Family Medicine

## 2015-09-09 DIAGNOSIS — Z862 Personal history of diseases of the blood and blood-forming organs and certain disorders involving the immune mechanism: Secondary | ICD-10-CM

## 2015-09-09 DIAGNOSIS — R9389 Abnormal findings on diagnostic imaging of other specified body structures: Secondary | ICD-10-CM

## 2015-09-09 DIAGNOSIS — R0602 Shortness of breath: Secondary | ICD-10-CM

## 2015-09-13 ENCOUNTER — Ambulatory Visit: Payer: PRIVATE HEALTH INSURANCE | Admitting: Adult Health

## 2015-09-15 ENCOUNTER — Encounter: Payer: Self-pay | Admitting: Physician Assistant

## 2016-03-27 ENCOUNTER — Ambulatory Visit: Payer: Self-pay | Admitting: Physician Assistant

## 2016-09-20 ENCOUNTER — Other Ambulatory Visit: Payer: Self-pay | Admitting: Physician Assistant

## 2016-09-20 DIAGNOSIS — J449 Chronic obstructive pulmonary disease, unspecified: Secondary | ICD-10-CM

## 2016-11-16 ENCOUNTER — Encounter (HOSPITAL_COMMUNITY): Payer: Self-pay | Admitting: Emergency Medicine

## 2016-11-16 ENCOUNTER — Ambulatory Visit (HOSPITAL_COMMUNITY)
Admission: EM | Admit: 2016-11-16 | Discharge: 2016-11-16 | Disposition: A | Discharge status: Home / Self Care | Payer: Self-pay | Attending: Emergency Medicine | Admitting: Emergency Medicine

## 2016-11-16 DIAGNOSIS — J40 Bronchitis, not specified as acute or chronic: Secondary | ICD-10-CM

## 2016-11-16 MED ORDER — SODIUM CHLORIDE 0.9 % IN NEBU
INHALATION_SOLUTION | RESPIRATORY_TRACT | Status: AC
  Filled 2016-11-16: qty 3

## 2016-11-16 MED ORDER — PREDNISONE 50 MG PO TABS: ORAL_TABLET | ORAL | 0 refills | Status: DC

## 2016-11-16 MED ORDER — AZITHROMYCIN 250 MG PO TABS: ORAL_TABLET | ORAL | 0 refills | Status: DC

## 2016-11-16 MED ORDER — ALBUTEROL SULFATE (2.5 MG/3ML) 0.083% IN NEBU
INHALATION_SOLUTION | RESPIRATORY_TRACT | Status: AC
  Filled 2016-11-16: qty 3

## 2016-11-16 MED ORDER — DEXAMETHASONE SODIUM PHOSPHATE 10 MG/ML IJ SOLN
10.0000 mg | Freq: Once | INTRAMUSCULAR | Status: AC
  Administered 2016-11-16: 10 mg via INTRAMUSCULAR

## 2016-11-16 MED ORDER — DEXAMETHASONE SODIUM PHOSPHATE 10 MG/ML IJ SOLN
INTRAMUSCULAR | Status: AC
  Filled 2016-11-16: qty 1

## 2016-11-16 MED ORDER — ALBUTEROL SULFATE (2.5 MG/3ML) 0.083% IN NEBU
2.5000 mg | INHALATION_SOLUTION | Freq: Once | RESPIRATORY_TRACT | Status: AC
  Administered 2016-11-16: 2.5 mg via RESPIRATORY_TRACT

## 2016-11-16 MED ORDER — IPRATROPIUM-ALBUTEROL 0.5-2.5 (3) MG/3ML IN SOLN
RESPIRATORY_TRACT | Status: AC
  Filled 2016-11-16: qty 3

## 2016-11-16 MED ORDER — IPRATROPIUM-ALBUTEROL 0.5-2.5 (3) MG/3ML IN SOLN
3.0000 mL | Freq: Once | RESPIRATORY_TRACT | Status: AC
  Administered 2016-11-16: 3 mL via RESPIRATORY_TRACT

## 2016-11-16 NOTE — ED Triage Notes (Signed)
Here for cold sx onset 1 week ++ associated w/left ear pain, prod cough, nasal drainage/congestion  Denies fevers  Taking Mucinex w/no relief  A&O x4... NAD

## 2016-11-16 NOTE — Discharge Instructions (Signed)
You have bronchitis. °Take azithromycin and prednisone as prescribed. °Use the albuterol every 4 hours as needed for wheezing or cough. °You should see improvement in the next 3-5 days. °If you develop fevers, difficulty breathing, or are just not getting better, please come back or go to the emergency room. ° °

## 2016-11-16 NOTE — ED Provider Notes (Signed)
[facility]    CSN: [id number] Arrival date & time: 11/16/16  1136     History   Chief Complaint Chief Complaint  Patient presents with  . URI    HPI [name] [name] is a 56 y.o. male.   HPI  He is a 56 year old man here for evaluation of sinus pressure. He reports about a one-week history of congestion and rhinorrhea. This is associated with left-sided facial and ear pain. He describes a paroxysmal cough as well as some intermittent wheezing. No fevers or shortness of breath. He has tried Mucinex without much improvement. He tried a allergy and sinus medication with modest improvement. He has also used his inhaler without much improvement.  Past Medical History:  Diagnosis Date  . Arthritis    "ankles and wrists" (09/26/2012)  . Complication of anesthesia 1985   "took me 2 days 2 come out of it; doesn't take much to put me to sleep" (09/26/2012)  . Psoriasis    "legs and elbows" (09/26/2012)  . Syncope and collapse    "3 rimes in the last 2 wk"(09/26/2012)    Patient Active Problem List   Diagnosis Date Noted  . 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 COPD (chronic obstructive pulmonary disease) (HCC) 09/06/2015  . Acute bronchitis 08/19/2013  . Viral illness 08/17/2013  . Wheezing 08/17/2013  . CAD, incidental LAD calcification noted on CT scan 09/27/2012  . Smoker 09/26/2012  . Abnormal EKG, LVH with repol 09/26/2012  . Psoriasis, on Enbrel prior to admission 09/26/2012  . Syncope, history consistant with arrythmia, (NL LVF on echo) 09/25/2012  . Abnormal chest CT, suspected acute pulmonary process secondary to immunosuppression from Enbrel 09/25/2012    Past Surgical History:  Procedure Laterality Date  . TONSILLECTOMY  1985  . VIDEO BRONCHOSCOPY  09/26/2012   Procedure: VIDEO BRONCHOSCOPY WITH FLUORO;  Surgeon: Oretha Milch, MD;  Location: [facility] ENDOSCOPY;  Service: Cardiopulmonary;  Laterality: Bilateral;       Home Medications    Prior  to Admission medications   Medication Sig Start Date End Date Taking? Authorizing Provider  azithromycin (ZITHROMAX Z-PAK) 250 MG tablet Take 2 pills today, then 1 pill daily until gone. 11/16/16   Charm Rings, MD  naproxen sodium (ANAPROX) 220 MG tablet Take 220 mg by mouth 2 (two) times daily with a meal.    Historical Provider, MD  predniSONE (DELTASONE) 50 MG tablet Take 1 pill daily for 5 days. 11/16/16   Charm Rings, MD  PROVENTIL HFA 108 [phone] Base) MCG/ACT inhaler INHALE 2 PUFFS INTO THE LUNGS EVERY 6 HOURS AS NEEDED FOR WHEEZING 09/20/16   Dorena Bodo, PA-C  valACYclovir (VALTREX) 1000 MG tablet Take 2 every 12 hours for 2 doses. 09/06/15   Dorena Bodo, PA-C    Family History Family History  Problem Relation Age of Onset  . Pulmonary fibrosis Mother   . Diabetes type II Father   . Diabetes type II Brother   . Heart disease Other     Social History Social History  Substance Use Topics  . Smoking status: Former Smoker    Packs/day: 0.50    Years: 15.00    Types: Cigarettes    Quit date: 09/25/2012  . Smokeless tobacco: Never Used  . Alcohol use Yes     Comment: 09/26/2012 "3 beers/yr at the most; last beer 7-8 yr ago"     Allergies   Patient has no known allergies.   Review  of Systems Review of Systems As in history of present illness  Physical Exam Triage Vital Signs ED Triage Vitals [11/16/16 1240]  Enc Vitals Group     BP 115/85     Pulse Rate 85     Resp 16     Temp 98.3 F (36.8 C)     Temp Source Oral     SpO2 97 %     Weight      Height      Head Circumference      Peak Flow      Pain Score 1     Pain Loc      Pain Edu?      Excl. in GC?    No data found.   Updated Vital Signs BP 115/85 (BP Location: Left Arm)   Pulse 85   Temp 98.3 F (36.8 C) (Oral)   Resp 16   SpO2 97%   Visual Acuity Right Eye Distance:   Left Eye Distance:   Bilateral Distance:    Right Eye Near:   Left Eye Near:    Bilateral Near:     Physical Exam    Constitutional: He is oriented to person, place, and time. He appears well-developed and well-nourished. No distress.  HENT:  Right TM is normal. Left TM is retracted. No sinus tenderness. Oropharynx with minor erythema.  Neck: Neck supple.  Cardiovascular: Normal rate, regular rhythm and normal heart sounds.   No murmur heard. Pulmonary/Chest: Effort normal. No respiratory distress. He has wheezes. He has no rales.  Rhonchi  Lymphadenopathy:    He has no cervical adenopathy.  Neurological: He is alert and oriented to person, place, and time.     UC Treatments / Results  Labs (all labs ordered are listed, but only abnormal results are displayed) Labs Reviewed - No data to display  EKG  EKG Interpretation None       Radiology No results found.  Procedures Procedures (including critical care time)  Medications Ordered in UC Medications  ipratropium-albuterol (DUONEB) 0.5-2.5 (3) MG/3ML nebulizer solution 3 mL (3 mLs Nebulization Given 11/16/16 1341)  albuterol (PROVENTIL) (2.5 MG/3ML) 0.083% nebulizer solution 2.5 mg (2.5 mg Nebulization Given 11/16/16 1406)  dexamethasone (DECADRON) injection 10 mg (10 mg Intramuscular Given 11/16/16 1404)     Initial Impression / Assessment and Plan / UC Course  I have reviewed the triage vital signs and the nursing notes.  Pertinent labs & imaging results that were available during my care of the patient were reviewed by me and considered in my medical decision making (see chart for details).  Clinical Course     Lung sounds improved after DuoNeb, but still with scattered wheezes. Wheezing completely resolved after second albuterol nebulizer. Patient reports significant relief of wheezing.  Treat with azithromycin and prednisone. He has an albuterol inhaler at home. Follow-up as needed.  Final Clinical Impressions(s) / UC Diagnoses   Final diagnoses:  Bronchitis    New Prescriptions New Prescriptions   AZITHROMYCIN  (ZITHROMAX Z-PAK) 250 MG TABLET    Take 2 pills today, then 1 pill daily until gone.   PREDNISONE (DELTASONE) 50 MG TABLET    Take 1 pill daily for 5 days.     Charm RingsErin J Honig, MD 11/16/16 1430

## 2016-11-20 ENCOUNTER — Ambulatory Visit: Payer: Self-pay | Admitting: Family Medicine

## 2016-12-08 ENCOUNTER — Other Ambulatory Visit: Payer: Self-pay | Admitting: Physician Assistant

## 2016-12-08 DIAGNOSIS — J449 Chronic obstructive pulmonary disease, unspecified: Secondary | ICD-10-CM

## 2017-01-02 ENCOUNTER — Other Ambulatory Visit: Payer: Self-pay | Admitting: Physician Assistant

## 2017-01-02 DIAGNOSIS — J449 Chronic obstructive pulmonary disease, unspecified: Secondary | ICD-10-CM

## 2017-01-03 NOTE — Telephone Encounter (Signed)
Refill appropriate 

## 2017-01-28 ENCOUNTER — Other Ambulatory Visit: Payer: Self-pay | Admitting: Physician Assistant

## 2017-01-28 DIAGNOSIS — J449 Chronic obstructive pulmonary disease, unspecified: Secondary | ICD-10-CM

## 2017-01-29 NOTE — Telephone Encounter (Signed)
Refill appropriate 

## 2017-05-08 IMAGING — CR DG CHEST 2V
2 series · 2 of 2 positions shown · non-contrast
Comparison: PA and lateral chest x-ray dated October 01, 2012

CLINICAL DATA: Cough, shortness of breath, and wheezing over the
past year, history of sarcoidosis, Celsius OPD, previous tobacco
use.

EXAM:
CHEST  2 VIEW

[w chest pa]
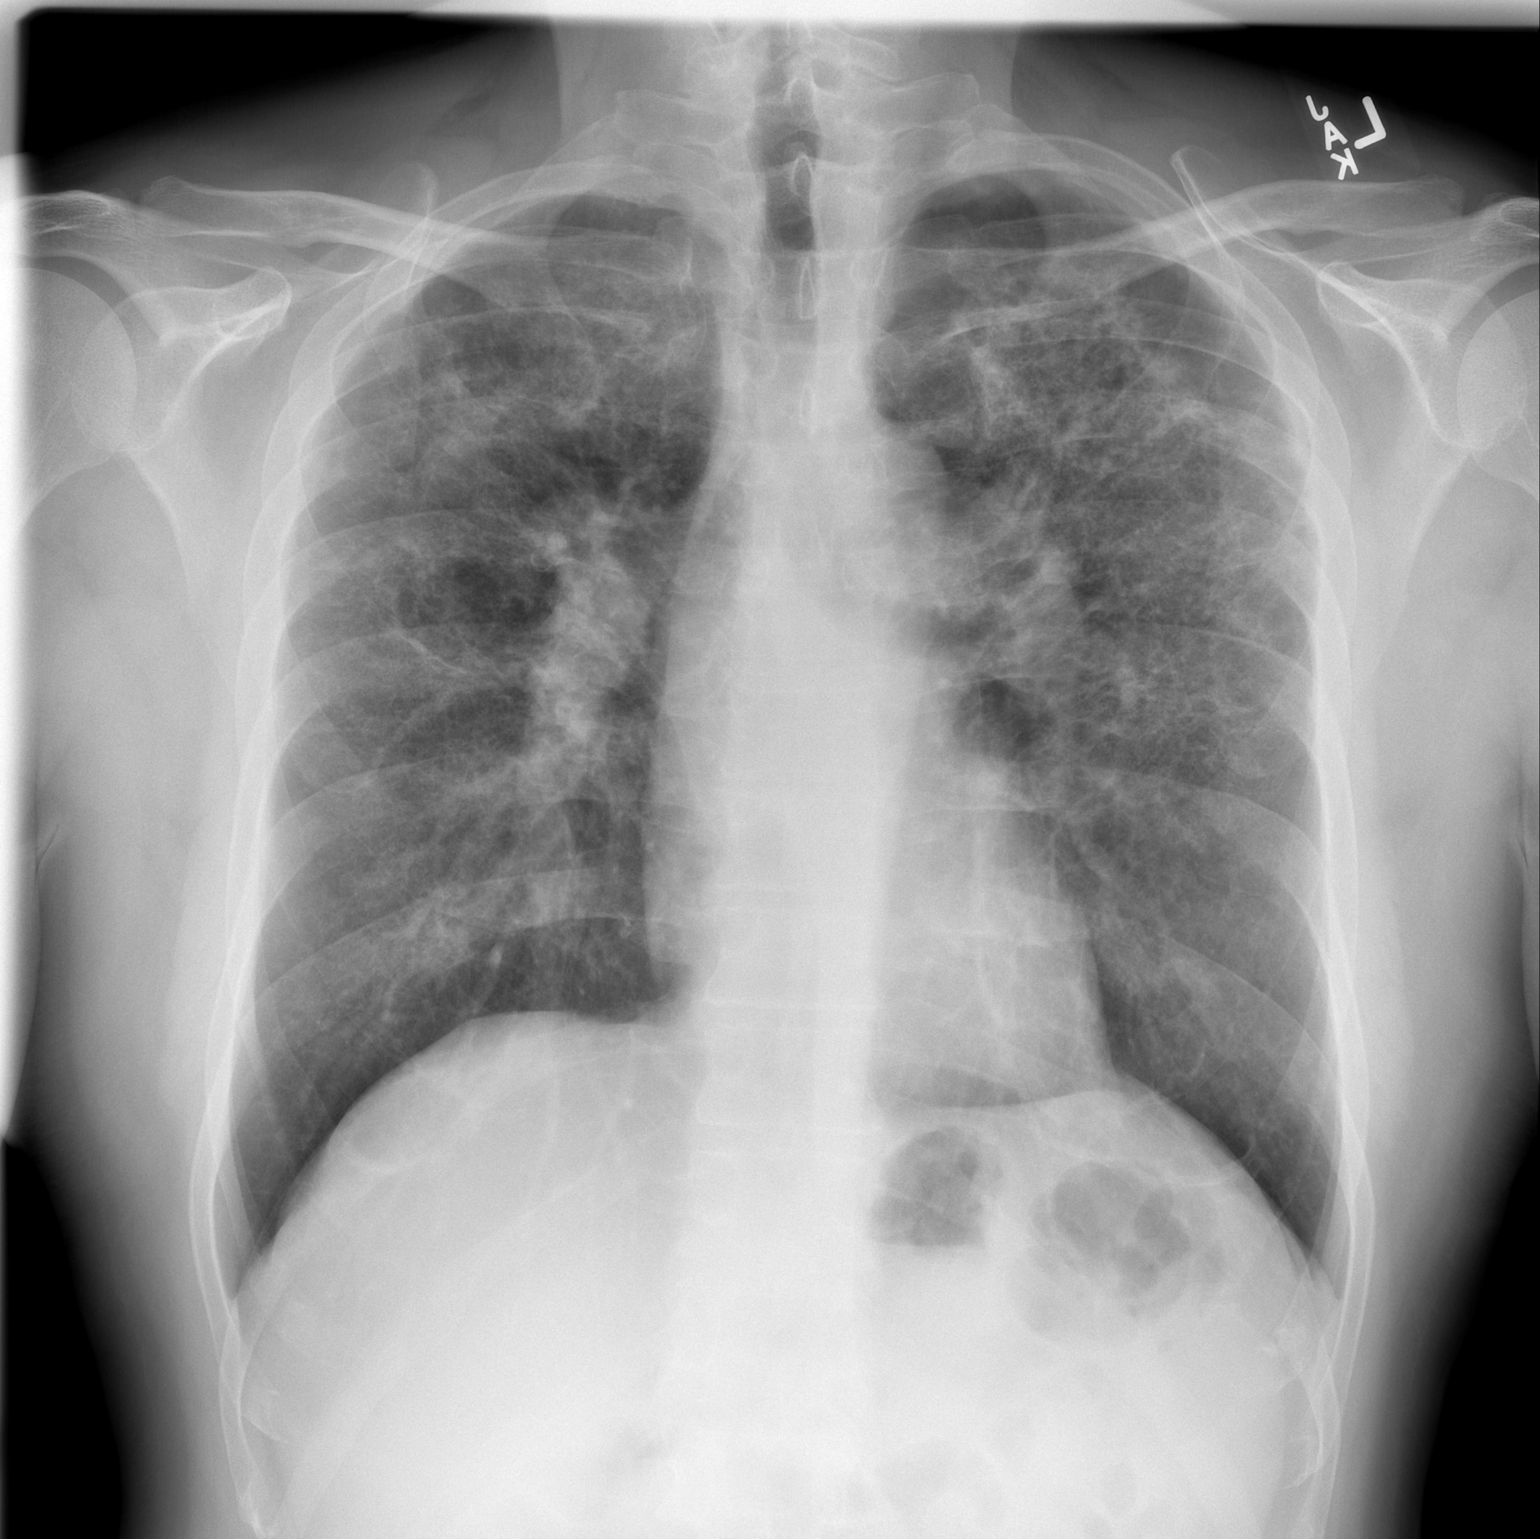

[w chest lat]
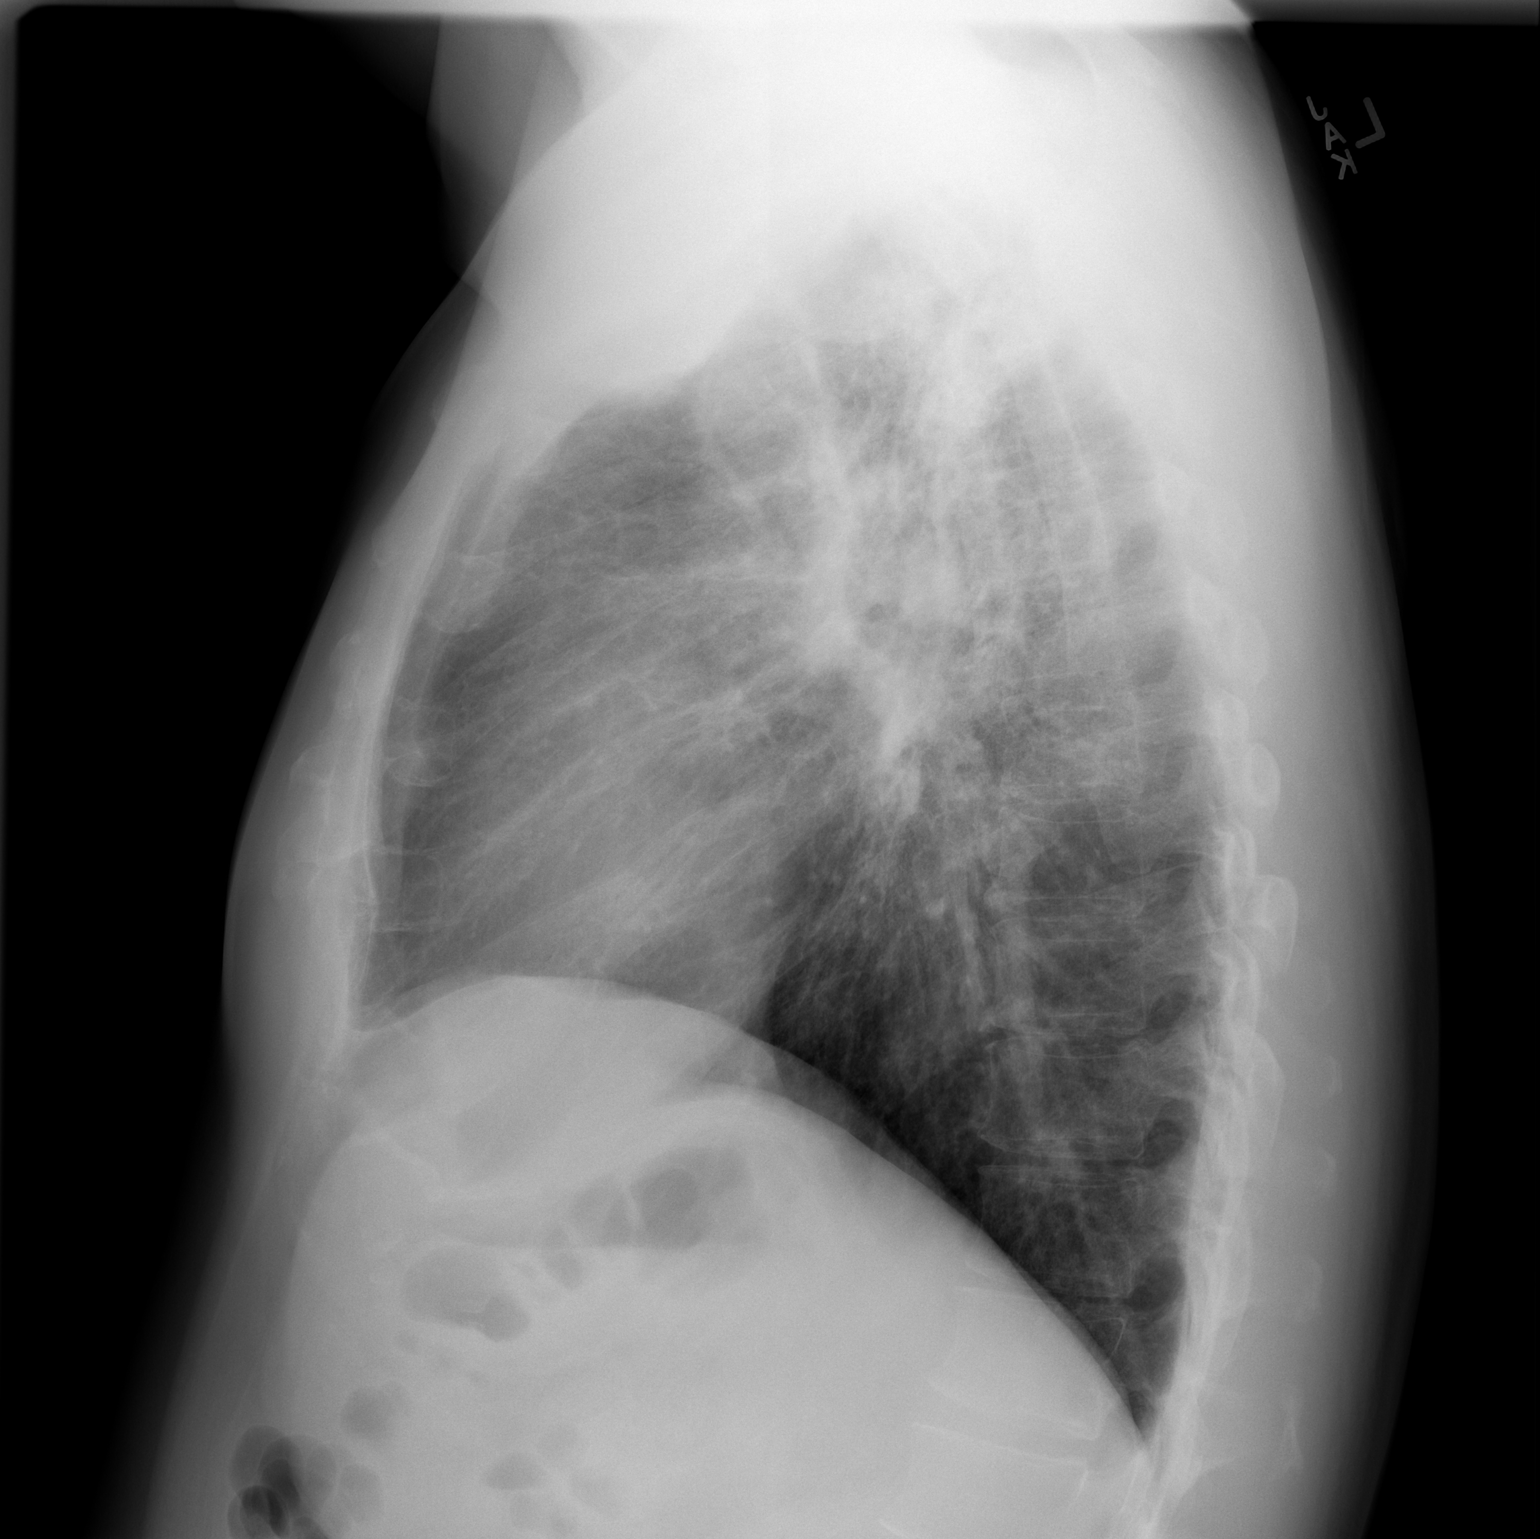

[2 of 2 positions shown; findings below may reference images not displayed]

FINDINGS: The lungs are well-expanded. The interstitial markings are diffusely
increased but not greatly changed. The heart is normal in size. The
hilar structures are prominent. The mediastinum is normal in width.
There is no pleural effusion. The bony thorax is unremarkable.
IMPRESSION: Chronic fibrotic changes with chronic hilar prominence consistent
with sarcoidosis. No acute pulmonary parenchymal abnormalities nor
CHF are observed.

## 2017-08-29 ENCOUNTER — Other Ambulatory Visit: Payer: Self-pay | Admitting: Physician Assistant

## 2017-08-29 DIAGNOSIS — J449 Chronic obstructive pulmonary disease, unspecified: Secondary | ICD-10-CM

## 2017-08-30 ENCOUNTER — Other Ambulatory Visit: Payer: Self-pay

## 2017-08-30 DIAGNOSIS — J449 Chronic obstructive pulmonary disease, unspecified: Secondary | ICD-10-CM

## 2017-08-30 MED ORDER — ALBUTEROL SULFATE HFA 108 (90 BASE) MCG/ACT IN AERS: INHALATION_SPRAY | RESPIRATORY_TRACT | 0 refills | Status: DC

## 2017-08-30 NOTE — Telephone Encounter (Signed)
Patient has an upcoming appointment on 09/03/2017

## 2017-09-03 ENCOUNTER — Ambulatory Visit (INDEPENDENT_AMBULATORY_CARE_PROVIDER_SITE_OTHER): Payer: Self-pay | Admitting: Physician Assistant

## 2017-09-03 ENCOUNTER — Encounter: Payer: Self-pay | Admitting: Physician Assistant

## 2017-09-03 VITALS — BP 118/84 | HR 84 | Temp 98.2°F | Resp 14 | Ht 75.0 in | Wt 243.0 lb

## 2017-09-03 DIAGNOSIS — D869 Sarcoidosis, unspecified: Secondary | ICD-10-CM

## 2017-09-03 NOTE — Progress Notes (Signed)
Patient ID: [name] [name] MRN: [id number], DOB: 20-Aug-1961, 56 y.o. Date of Encounter: 09/03/2017, 3:20 PM    Chief Complaint:  Chief Complaint  Patient presents with  . breathing problems     HPI: 56 y.o. year old male presents for above.   Reports that he is here for follow-up of the same breathing issues that he had at his last visit here.  I reviewed his last office visit note with me 09/06/2015. During that visit he said that he needed a refill on his inhaler. At that time he reported that he was using it every night when he would lie down to go to bed. Said that when he would lie down at night to go to sleep he would start cough. If he used the inhaler then he would not have the coughing. At that time I reviewed his chart, noted that he had had an abnormal chest CT. Also noted that he had been a smoker and reported quitting 3 months prior to that visit. At that visit 09/06/2015 O ordered f/u chest x-ray.  Chest x-ray was performed 09/06/2015. "Impression: Chronic fibrotic changes with chronic hilar prominence consistent with sarcoidosis. No acute pulmonary parenchymal abnormalities nor CHF observed."  Today patient reports that the only medical provider he has seen since then was just an urgent care visit this past January. Reports that he has seen no other medical provider since being here 09/06/2015. When I discussed needing follow-up with pulmonary he asked if there are different pulmonologist to see and if any would be better to see given that he has no insurance. I discussed that he definitely needs to see pulmonary and that he can see about getting on a payment plan. He voices understanding and states that he definitely will follow-up.  Today he reports that: "When he lays down at night he develops wheezing and can't sleep. He will then cough and that will clear it so that he can sleep." Also reports that "sometimes I will cough really hard"  He has no other specific  concerns to address today.    Home Meds:   Outpatient Medications Prior to Visit  Medication Sig Dispense Refill  . albuterol (PROVENTIL HFA) 108 (90 Base) MCG/ACT inhaler INHALE 2 PUFFS INTO THE LUNGS EVERY 6 HOURS AS NEEDED FOR WHEEZING 6.7 g 0  . azithromycin (ZITHROMAX Z-PAK) 250 MG tablet Take 2 pills today, then 1 pill daily until gone. 6 tablet 0  . naproxen sodium (ANAPROX) 220 MG tablet Take 220 mg by mouth 2 (two) times daily with a meal.    . predniSONE (DELTASONE) 50 MG tablet Take 1 pill daily for 5 days. 5 tablet 0  . valACYclovir (VALTREX) 1000 MG tablet Take 2 every 12 hours for 2 doses. 30 tablet 5   No facility-administered medications prior to visit.     Allergies: No Known Allergies    Review of Systems: See HPI for pertinent ROS. All other ROS negative.    Physical Exam: Blood pressure 118/84, pulse 84, temperature 98.2 F (36.8 C), temperature source Oral, resp. rate 14, height 6\' 3"  (1.905 m), weight 110.2 kg (243 lb), SpO2 95 %., Body mass index is 30.37 kg/m. General:  [name]. Appears in no acute distress. Neck: Supple. No thyromegaly. No lymphadenopathy. Lungs: There is inspiratory stridor throughout bilaterally. Some expiratory wheeze at the very end of expiration and this is also scattered throughout bilaterally. Heart: Regular rhythm. No murmurs, rubs, or gallops. Msk:  Strength and  tone normal for age. Extremities/Skin: Warm and dry.  Neuro: Alert and oriented X 3. Moves all extremities spontaneously. Gait is normal. CNII-XII grossly in tact. Psych:  Responds to questions appropriately with a normal affect.     ASSESSMENT AND PLAN:  56 y.o. year old male with  1. Sarcoidosis Chest x-ray was performed 09/06/2015. "Impression: Chronic fibrotic changes with chronic hilar prominence consistent with sarcoidosis. No acute pulmonary parenchymal abnormalities nor CHF observed." When I discussed needing follow-up with pulmonary he asked if there are  different pulmonologist to see and if any would be better to see given that he has no insurance. I discussed that he definitely needs to see pulmonary and that he can see about getting on a payment plan. He voices understanding and states that he definitely will follow-up.  When I had reviewed his chest x-ray results from 09/06/2015, I had recommended and put in pulmonary referral. However he never had follow-up with pulmonary. Today I have told him the necessity for follow-up of pulmonary. Today I've also told him that if for some reason he does not hear anything regarding this appointment -- then he needs to call here and speak to Selena Batten -I have given him our phone number. He voices understanding and agrees on need for follow-up with pulmonary. - Ambulatory referral to Pulmonology   Signed, Shon Hale [place], [place], [facility] 09/03/2017 3:20 PM

## 2017-09-10 ENCOUNTER — Telehealth: Payer: Self-pay

## 2017-09-10 NOTE — Telephone Encounter (Signed)
He had chest x-ray in 08/2015 that showed findings consistent with sarcoidosis. At his recent office visit we discussed that x-ray finding again and discussed the fact that he never did follow-up with pulmonary and discussed need for follow-up with pulmonary. He needs to see [facility].

## 2017-09-10 NOTE — Telephone Encounter (Signed)
Spoke with patient he is aware of provider recommendations 

## 2017-09-10 NOTE — Telephone Encounter (Signed)
Patient was in office on 10-29 and states he has a lot of chest congestion and is requesting a rx for z-pak patient states that has worked for him in the past

## 2017-09-17 ENCOUNTER — Institutional Professional Consult (permissible substitution): Payer: PRIVATE HEALTH INSURANCE | Admitting: Internal Medicine

## 2017-09-22 ENCOUNTER — Other Ambulatory Visit: Payer: Self-pay | Admitting: Physician Assistant

## 2017-09-22 DIAGNOSIS — J449 Chronic obstructive pulmonary disease, unspecified: Secondary | ICD-10-CM

## 2017-09-24 NOTE — Telephone Encounter (Signed)
Refill appropriate 

## 2017-09-26 ENCOUNTER — Ambulatory Visit (INDEPENDENT_AMBULATORY_CARE_PROVIDER_SITE_OTHER)
Admission: RE | Admit: 2017-09-26 | Discharge: 2017-09-26 | Disposition: A | Discharge status: Home / Self Care | Payer: Self-pay | Source: Ambulatory Visit | Attending: Internal Medicine | Admitting: Internal Medicine

## 2017-09-26 ENCOUNTER — Encounter: Payer: Self-pay | Admitting: Internal Medicine

## 2017-09-26 ENCOUNTER — Ambulatory Visit (INDEPENDENT_AMBULATORY_CARE_PROVIDER_SITE_OTHER): Payer: Self-pay | Admitting: Internal Medicine

## 2017-09-26 VITALS — BP 126/86 | HR 83 | Ht 75.0 in | Wt 244.0 lb

## 2017-09-26 DIAGNOSIS — J45991 Cough variant asthma: Secondary | ICD-10-CM

## 2017-09-26 DIAGNOSIS — R590 Localized enlarged lymph nodes: Secondary | ICD-10-CM

## 2017-09-26 MED ORDER — AMOXICILLIN-POT CLAVULANATE 875-125 MG PO TABS: 1.0000 | ORAL_TABLET | Freq: Two times a day (BID) | ORAL | 0 refills | Status: AC

## 2017-09-26 MED ORDER — PREDNISONE 10 MG PO TABS: ORAL_TABLET | ORAL | 0 refills | Status: DC

## 2017-09-26 NOTE — Progress Notes (Signed)
Subjective:     Patient ID: [name] [name], male   DOB: 1961-03-07,   MRN: [id number]  HPI  89 yowm prev w/u for and abn cxr with calcified nodes and neg FOB  09/26/12 quit smoking completely 2016 but had cut down considerably since 2013 with tendency to strep throat s/p tonsilectomy age 56 with new onset sob/cough/wheeze/nasal congestion  in 2016 better with prednisone onset was in fall sept/oct and occurred late Dec 2017 both times eliminated with prednisone / prn albuterol qod since then but acutely ill since early Oct 2018 same syndrome this time no rx other than alb and referred to pulmonary clinic 09/26/2017 by [name] [name]     09/26/2017 1st [facility] office visit/ [name] [name]   Chief Complaint  Patient presents with  . Advice Only    sent by PCP, he did a z pack and prednisone and it helped tremendously, last prescriptions done last year in october, pcp referred becuase of multiple episodes, coughing up brown/yellow, wheezing and chest tightness frequently  onset x 6 weeks, nasal congestion esp at hs with wheeze / better on saba w/in a few min using 5/7 noct but never daytime Each am coughs up brown mucus x maybe a tsp vol/ head always stuffy in am when wheezy/cough/ sob  New cat x one year x when exp nose gets clogged   No obvious other patterns in day to day or daytime variability or assoc   mucus plugs or hemoptysis or cp or chest tightness,  or hb symptoms. No unusual exp hx or h/o childhood pna/ asthma or knowledge of premature birth.   . Also denies any obvious fluctuation of symptoms with weather or environmental changes or other aggravating or alleviating factors except as outlined above   Current Allergies, Complete Past Medical History, Past Surgical History, Family History, and Social History were reviewed in [facility] electronic medical record.  ROS  The following are not active complaints unless bolded Hoarseness, sore throat, dysphagia, dental problems, itching,  sneezing,  nasal congestion or discharge of excess mucus or purulent secretions, ear ache,   fever, chills, sweats, unintended wt loss or wt gain, classically pleuritic or exertional cp,  orthopnea pnd or leg swelling, presyncope, palpitations, abdominal pain, anorexia, nausea, vomiting, diarrhea  or change in bowel habits or change in bladder habits, change in stools or change in urine, dysuria, hematuria,  rash, arthralgias, visual complaints, headache, numbness, weakness or ataxia or problems with walking or coordination,  change in mood/affect= gets shaky and nervous when sob  Or memory.        Current Meds  Medication Sig  . albuterol (PROVENTIL HFA) 108 (90 Base) MCG/ACT inhaler INHALE 2 PUFFS INTO THE LUNGS EVERY 6 HOURS AS NEEDED FOR WHEEZING            Review of Systems     Objective:   Physical Exam amb wm nad   Wt Readings from Last 3 Encounters:  09/26/17 244 lb (110.7 kg)  09/03/17 243 lb (110.2 kg)  09/06/15 225 lb (102.1 kg)    Vital signs reviewed  - Note on arrival 02 sats  96% on RA      .HEENT: nl dentition,   and oropharynx. Nl external ear canals without cough reflex - moderate to severe  bilateral non-specific turbinate edema / crusting with mp secretions     NECK :  without JVD/Nodes/TM/ nl carotid upstrokes bilaterally   LUNGS: no acc muscle use,  Nl contour chest with  upper > lower insp /exp wheeze    CV:  RRR  no s3 or murmur or increase in P2, and no edema   ABD:  soft and nontender with nl inspiratory excursion in the supine position. No bruits or organomegaly appreciated, bowel sounds nl  MS:  Nl gait/ ext warm without deformities, calf tenderness, cyanosis or clubbing No obvious joint restrictions   SKIN: warm and dry without lesions    NEURO:  alert, approp, nl sensorium with  no motor or cerebellar deficits apparent.     CXR PA and Lateral:   09/26/2017 :    I personally reviewed images and agree with radiology impression as  follows:   Stable bilateral upper lobe interstitial densities are noted most consistent with scarring or fibrosis, but acute superimposed edema or inflammation cannot be excluded.        Assessment:

## 2017-09-26 NOTE — Assessment & Plan Note (Addendum)
DDX of  difficult airways management almost all start with A and  include Adherence, Ace Inhibitors, Acid Reflux, Active Sinus Disease, Alpha 1 Antitripsin deficiency, Anxiety masquerading as Airways dz,  ABPA,  Allergy(esp in young), Aspiration (esp in elderly), Adverse effects of meds,  Active smokers, A bunch of PE's (a small clot burden can't cause this syndrome unless there is already severe underlying pulm or vascular dz with poor reserve) plus two Bs  = Bronchiectasis and Beta blocker use..and one C= CHF   Adherence is always the initial "prime suspect" and is a multilayered concern that requires a "trust but verify" approach in every patient - starting with knowing how to use medications, especially inhalers, correctly, keeping up with refills and understanding the fundamental difference between maintenance and prns vs those medications only taken for a very short course and then stopped and not refilled.  - no insurance may make this challenging in low term to control - return with all meds in hand using a trust but verify approach to confirm accurate Medication  Reconciliation The principal here is that until we are certain that the  patients are doing what we've asked, it makes no sense to ask them to do more.   ? Allergy > avoid cats/ Prednisone 10 mg take  4 each am x 2 days,   2 each am x 2 days,  1 each am x 2 days and stop   ? Active sinus dz > augmentin x 10 days, consider nasal steroids/ ct sinus if not improved   ? Bronchiectasis >  augmentin will cover for now, may benefit also from flutter (see sep a/p)    Will return to complete the w/u with full pfts in 6 weeks  Discussed in detail all the  indications, usual  risks and alternatives  relative to the benefits with patient who agrees to proceed with w/u as outlined.      Total time devoted to counseling  > 50 % of initial 45 min office visit:  review case with pt/ discussion of options/alternatives/ personally creating written  customized instructions  in presence of pt  then going over those specific  Instructions directly with the pt including how to use all of the meds but in particular covering each new medication in detail and the difference between the maintenance= "automatic" meds and the prns using an action plan format for the latter (If this problem/symptom => do that organization reading Left to right).  Please see AVS from this visit for a full list of these instructions which I personally wrote for this pt and  are unique to this visit.

## 2017-09-26 NOTE — Patient Instructions (Signed)
Augmentin 875 mg take one pill twice daily  X 10 days - take at breakfast and supper with large glass of water.  It would help reduce the usual side effects (diarrhea and yeast infections) if you ate cultured yogurt at lunch.   Prednisone 10 mg take  4 each am x 2 days,   2 each am x 2 days,  1 each am x 2 days and stop    Please remember to go to the  x-ray department downstairs in the basement  for your tests - we will call you with the results when they are available.      Please schedule a follow up office visit in 6 weeks, call sooner if needed bring your inhaler with you

## 2017-09-26 NOTE — Assessment & Plan Note (Addendum)
CTa 09/25/12 Mediastinal and hilar adenopathy are present.  14 mm right paratracheal node on image 29.  9 mm right paratracheal node on image 22.  1.7 cm subcarinal node on image 41.  2.1 cm right hilar node on image 38.  1.7 cm left hilar node on image 41.  All of the nodal tissue contains calcifications.   There are heterogeneous opacities throughout both lungs with a central and upper lobe distribution.  This is characterized by ground-glass opacities, ill-defined pulmonary opacities, and ground-glass opacities.  This is associated with bilateral apical bronchiectasis.  The largest focal opacity in the right upper lobe on image 30 measures 1.7 x 1.4 cm.  There is no subpleural nodularity or nodularity within the airways. FOB neg TBBX Vassie Loll([place])   09/26/12    cxr unchanged, no further w/u indicated

## 2017-09-28 ENCOUNTER — Encounter: Payer: Self-pay | Admitting: Internal Medicine

## 2017-10-01 ENCOUNTER — Telehealth: Payer: Self-pay | Admitting: Internal Medicine

## 2017-10-01 NOTE — Telephone Encounter (Signed)
Notes recorded by Nyoka CowdenWert, Michael B, MD on 09/26/2017 at 2:33 PM EST Call pt: Reviewed cxr and no acute change so [facility]  Spoke with patient. He is aware of MW's recs. Nothing else needed at time of call.

## 2017-11-09 ENCOUNTER — Ambulatory Visit: Payer: Self-pay | Admitting: Internal Medicine

## 2017-11-14 ENCOUNTER — Ambulatory Visit: Payer: Self-pay | Admitting: Internal Medicine

## 2018-03-04 ENCOUNTER — Other Ambulatory Visit: Payer: Self-pay | Admitting: Physician Assistant

## 2018-03-04 DIAGNOSIS — J449 Chronic obstructive pulmonary disease, unspecified: Secondary | ICD-10-CM

## 2018-10-10 ENCOUNTER — Ambulatory Visit (HOSPITAL_COMMUNITY)
Admission: EM | Admit: 2018-10-10 | Discharge: 2018-10-10 | Disposition: A | Discharge status: Home / Self Care | Payer: Self-pay | Attending: Family Medicine | Admitting: Family Medicine

## 2018-10-10 ENCOUNTER — Other Ambulatory Visit: Payer: Self-pay

## 2018-10-10 ENCOUNTER — Encounter (HOSPITAL_COMMUNITY): Payer: Self-pay

## 2018-10-10 DIAGNOSIS — J449 Chronic obstructive pulmonary disease, unspecified: Secondary | ICD-10-CM

## 2018-10-10 DIAGNOSIS — J45991 Cough variant asthma: Secondary | ICD-10-CM

## 2018-10-10 MED ORDER — PREDNISONE 20 MG PO TABS: 20.0000 mg | ORAL_TABLET | Freq: Two times a day (BID) | ORAL | 1 refills | Status: AC

## 2018-10-10 MED ORDER — ALBUTEROL SULFATE HFA 108 (90 BASE) MCG/ACT IN AERS: INHALATION_SPRAY | RESPIRATORY_TRACT | 1 refills | Status: DC

## 2018-10-10 NOTE — ED Provider Notes (Signed)
[facility]    CSN: [id number] Arrival date & time: 10/10/18  1210     History   Chief Complaint Chief Complaint  Patient presents with  . Cough    HPI [name] [name] is a 57 y.o. male.   HPI Patient is here with shortness of breath and cough.  He states that he has known asthma.  He gets coughing and wheezing every fall.  He was seen here last year for similar symptoms.  Referred to pulmonology.  Diagnosed with cough variant asthma.  Currently he has been coughing with some shortness of breath for about 2 months.  No fever or chills.  No colored sputum.  No nausea or vomiting.  He is a non-smoker.  No known exposure to strep, pneumonia, illness.  He also states he cannot hear out of his left ear.  Thinks he has wax buildup.  Past Medical History:  Diagnosis Date  . Complication of anesthesia 1985   "took me 2 days 2 come out of it; doesn't take much to put me to sleep" (09/26/2012)  . Psoriasis    "legs and elbows" (09/26/2012)  . Syncope and collapse    "3 rimes in the last 2 wk"(09/26/2012)    Patient Active Problem List   Diagnosis Date Noted  . Cough variant asthma 09/26/2017  . Hilar adenopathy with calcifications and bronchiectasis  09/26/2017  . 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 COPD (chronic obstructive pulmonary disease) (HCC) 09/06/2015  . Acute bronchitis 08/19/2013  . Viral illness 08/17/2013  . Wheezing 08/17/2013  . CAD, incidental LAD calcification noted on CT scan 09/27/2012  . Smoker 09/26/2012  . Abnormal EKG, LVH with repol 09/26/2012  . Psoriasis, on Enbrel prior to admission 09/26/2012  . Syncope, history consistant with arrythmia, (NL LVF on echo) 09/25/2012  . Abnormal chest CT, suspected acute pulmonary process secondary to immunosuppression from Enbrel 09/25/2012    Past Surgical History:  Procedure Laterality Date  . TONSILLECTOMY  1985  . VIDEO BRONCHOSCOPY  09/26/2012   Procedure: VIDEO BRONCHOSCOPY WITH FLUORO;   Surgeon: Oretha Milch, MD;  Location: [facility] ENDOSCOPY;  Service: Cardiopulmonary;  Laterality: Bilateral;       Home Medications    Prior to Admission medications   Medication Sig Start Date End Date Taking? Authorizing Provider  albuterol (PROVENTIL HFA) 108 (90 Base) MCG/ACT inhaler INHALE 2 PUFFS INTO THE LUNGS EVERY 6 HOURS AS NEEDED FOR WHEEZING 10/10/18   Eustace Moore, MD  predniSONE (DELTASONE) 20 MG tablet Take 1 tablet (20 mg total) by mouth 2 (two) times daily with a meal. 10/10/18   Eustace Moore, MD    Family History Family History  Problem Relation Age of Onset  . Pulmonary fibrosis Mother   . Diabetes type II Father   . Diabetes type II Brother   . Heart disease Other     Social History Social History   Tobacco Use  . Smoking status: Former Smoker    Packs/day: 0.50    Years: 15.00    Pack years: 7.50    Types: Cigarettes    Last attempt to quit: 09/25/2012    Years since quitting: 6.0  . Smokeless tobacco: Never Used  Substance Use Topics  . Alcohol use: Yes    Comment: 09/26/2012 "3 beers/yr at the most; last beer 7-8 yr ago"  . Drug use: No     Allergies   Patient has no known allergies.   Review of  Systems Review of Systems  Constitutional: Negative for activity change, appetite change, chills and fever.  HENT: Positive for hearing loss. Negative for congestion, ear pain and sore throat.   Eyes: Negative for pain and visual disturbance.  Respiratory: Positive for cough, shortness of breath and wheezing.   Cardiovascular: Negative for chest pain and palpitations.  Gastrointestinal: Negative for abdominal pain and vomiting.  Genitourinary: Negative for dysuria and hematuria.  Musculoskeletal: Negative for arthralgias and back pain.  Skin: Negative for color change and rash.  Neurological: Negative for seizures and syncope.  Psychiatric/Behavioral: Positive for sleep disturbance. The patient is not nervous/anxious.   All other systems  reviewed and are negative.    Physical Exam Triage Vital Signs ED Triage Vitals  Enc Vitals Group     BP 10/10/18 1324 118/79     Pulse Rate 10/10/18 1324 77     Resp 10/10/18 1324 18     Temp 10/10/18 1324 97.7 F (36.5 C)     Temp Source 10/10/18 1324 Oral     SpO2 10/10/18 1324 98 %     Weight 10/10/18 1327 235 lb (106.6 kg)   No data found.  Updated Vital Signs BP 118/79 (BP Location: Right Arm)   Pulse 77   Temp 97.7 F (36.5 C) (Oral)   Resp 18   Wt 106.6 kg   SpO2 98%   BMI 29.37 kg/m      Physical Exam  Constitutional: He appears well-developed and well-nourished. No distress.  HENT:  Head: Normocephalic and atraumatic.  Right Ear: External ear normal.  Mouth/Throat: Oropharynx is clear and moist.  Left-sided cerumen impaction  Eyes: Pupils are equal, round, and reactive to light. Conjunctivae are normal.  Neck: Normal range of motion.  Cardiovascular: Normal rate, regular rhythm and normal heart sounds.  Pulmonary/Chest: Effort normal. No respiratory distress. He has wheezes.  Wheezes throughout.  Anterior rhonchi.  No rales  Abdominal: Soft. Bowel sounds are normal. He exhibits no distension.  Musculoskeletal: Normal range of motion. He exhibits no edema.  Lymphadenopathy:    He has no cervical adenopathy.  Neurological: He is alert.  Skin: Skin is warm and dry.  Psychiatric: He has a normal mood and affect. His behavior is normal.  The left TM is irrigated.  Patient did not tolerate.  He is advised to get a earwax removal system at the pharmacy. He has wheezing throughout.  He was offered a DuoNeb treatment.  He does not have insurance and states that he would like to try it is going on the prednisone, he will return if he does not improve.   UC Treatments / Results  Labs (all labs ordered are listed, but only abnormal results are displayed) Labs Reviewed - No data to display  EKG None  Radiology No results found.  Procedures Procedures  (including critical care time)  Medications Ordered in UC Medications - No data to display  Initial Impression / Assessment and Plan / UC Course  I have reviewed the triage vital signs and the nursing notes.  Pertinent labs & imaging results that were available during my care of the patient were reviewed by me and considered in my medical decision making (see chart for details).     Refilled his prednisone and inhalers.  Discussed use.  Discussed asthma action plan. Final Clinical Impressions(s) / UC Diagnoses   Final diagnoses:  Cough variant asthma     Discharge Instructions     Take prednisone twice a day as  directed.  Take 2 doses today Use the inhaler as needed Push fluids  Refills are given in case she needs them Follow-up with pulmonary if you fail to improve See PCP for routine refills   ED Prescriptions    Medication Sig Dispense Auth. Provider   albuterol (PROVENTIL HFA) 108 (90 Base) MCG/ACT inhaler INHALE 2 PUFFS INTO THE LUNGS EVERY 6 HOURS AS NEEDED FOR WHEEZING 18 g Eustace MooreNelson, Yvonne Sue, MD   predniSONE (DELTASONE) 20 MG tablet Take 1 tablet (20 mg total) by mouth 2 (two) times daily with a meal. 10 tablet Eustace MooreNelson, Yvonne Sue, MD     Controlled Substance Prescriptions [place] Controlled Substance Registry consulted? Not Applicable   Eustace MooreNelson, Yvonne Sue, MD 10/10/18 1630

## 2018-10-10 NOTE — ED Notes (Signed)
Ear wax removal attempt x3 with minimal return; pt unable to proceed d/t pain; Dr. Delton SeeNelson notified and pt educated to use OTC ear wax removal

## 2018-10-10 NOTE — ED Triage Notes (Signed)
Pt cc he states he has a cough and SOB x 2 months or more.

## 2018-10-10 NOTE — Discharge Instructions (Signed)
Take prednisone twice a day as directed.  Take 2 doses today Use the inhaler as needed Push fluids  Refills are given in case she needs them Follow-up with pulmonary if you fail to improve See PCP for routine refills

## 2019-05-29 IMAGING — DX DG CHEST 2V
2 series · 2 of 2 positions shown · non-contrast
Comparison: Radiographs September 06, 2015.

CLINICAL DATA: Productive cough, shortness of breath.

EXAM:
CHEST  2 VIEW

[chest pa]
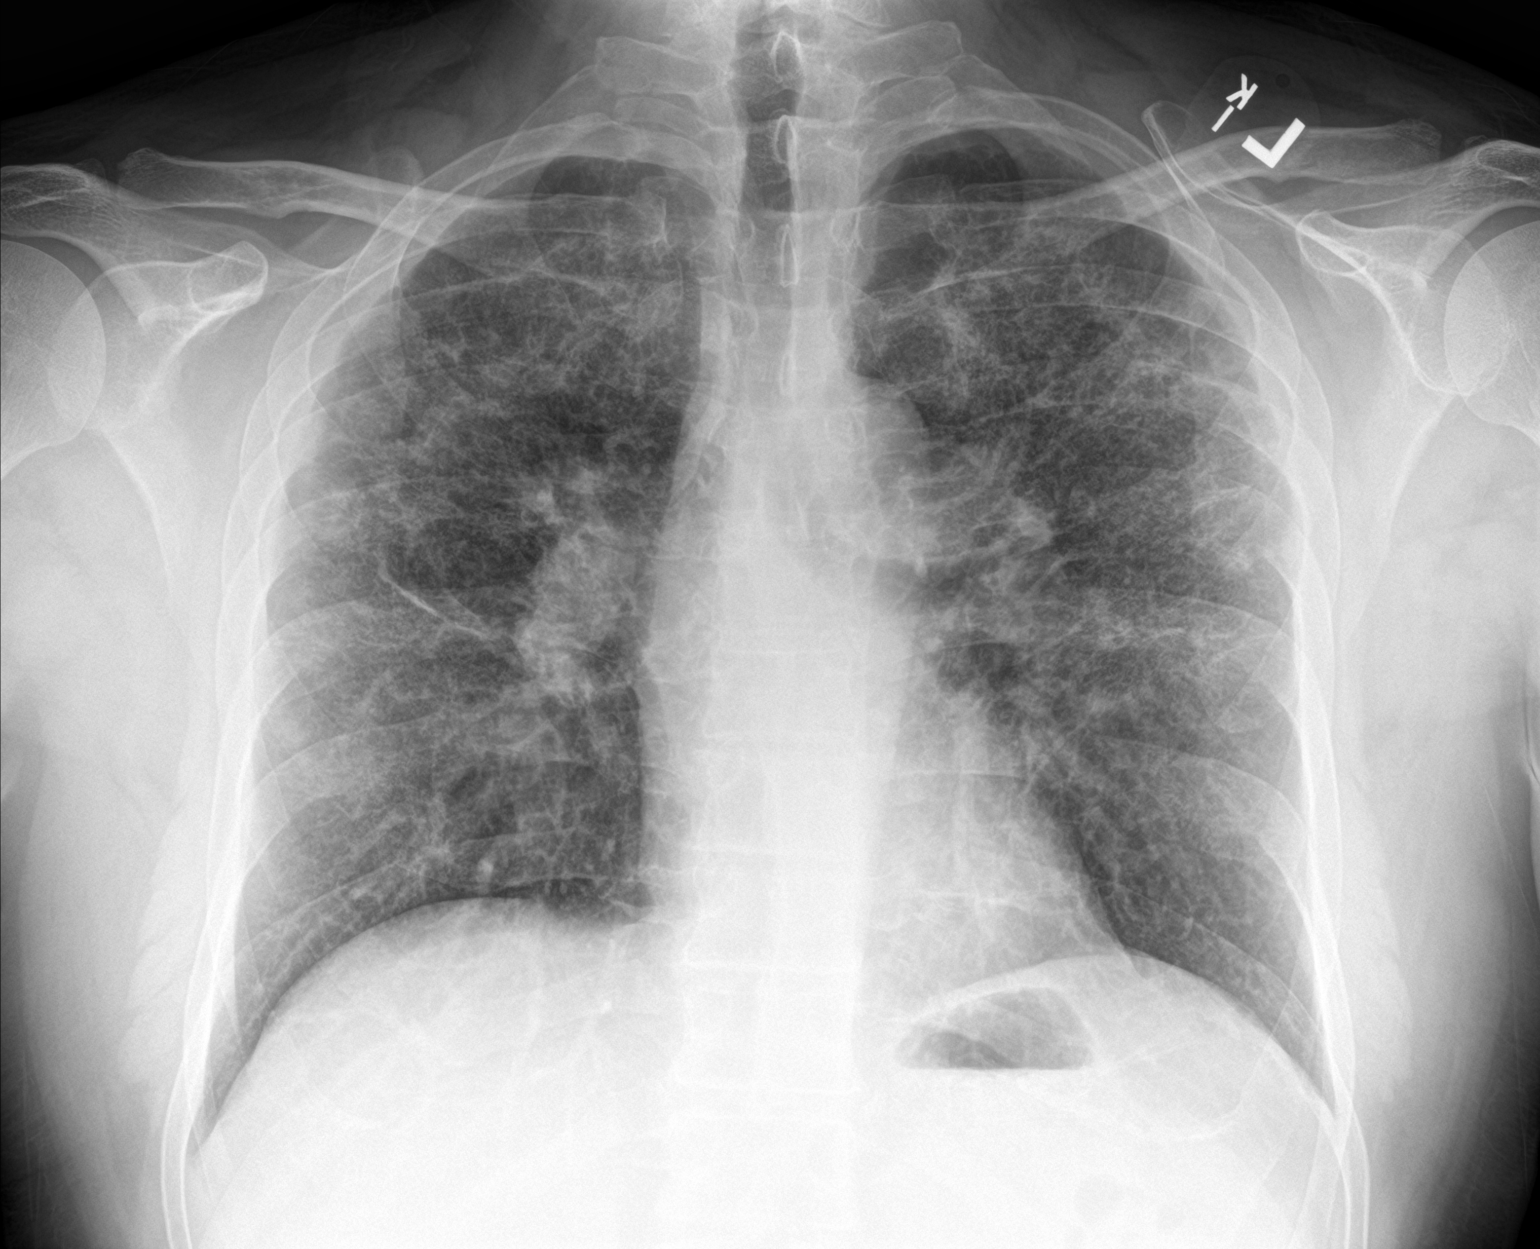

[chest lat]
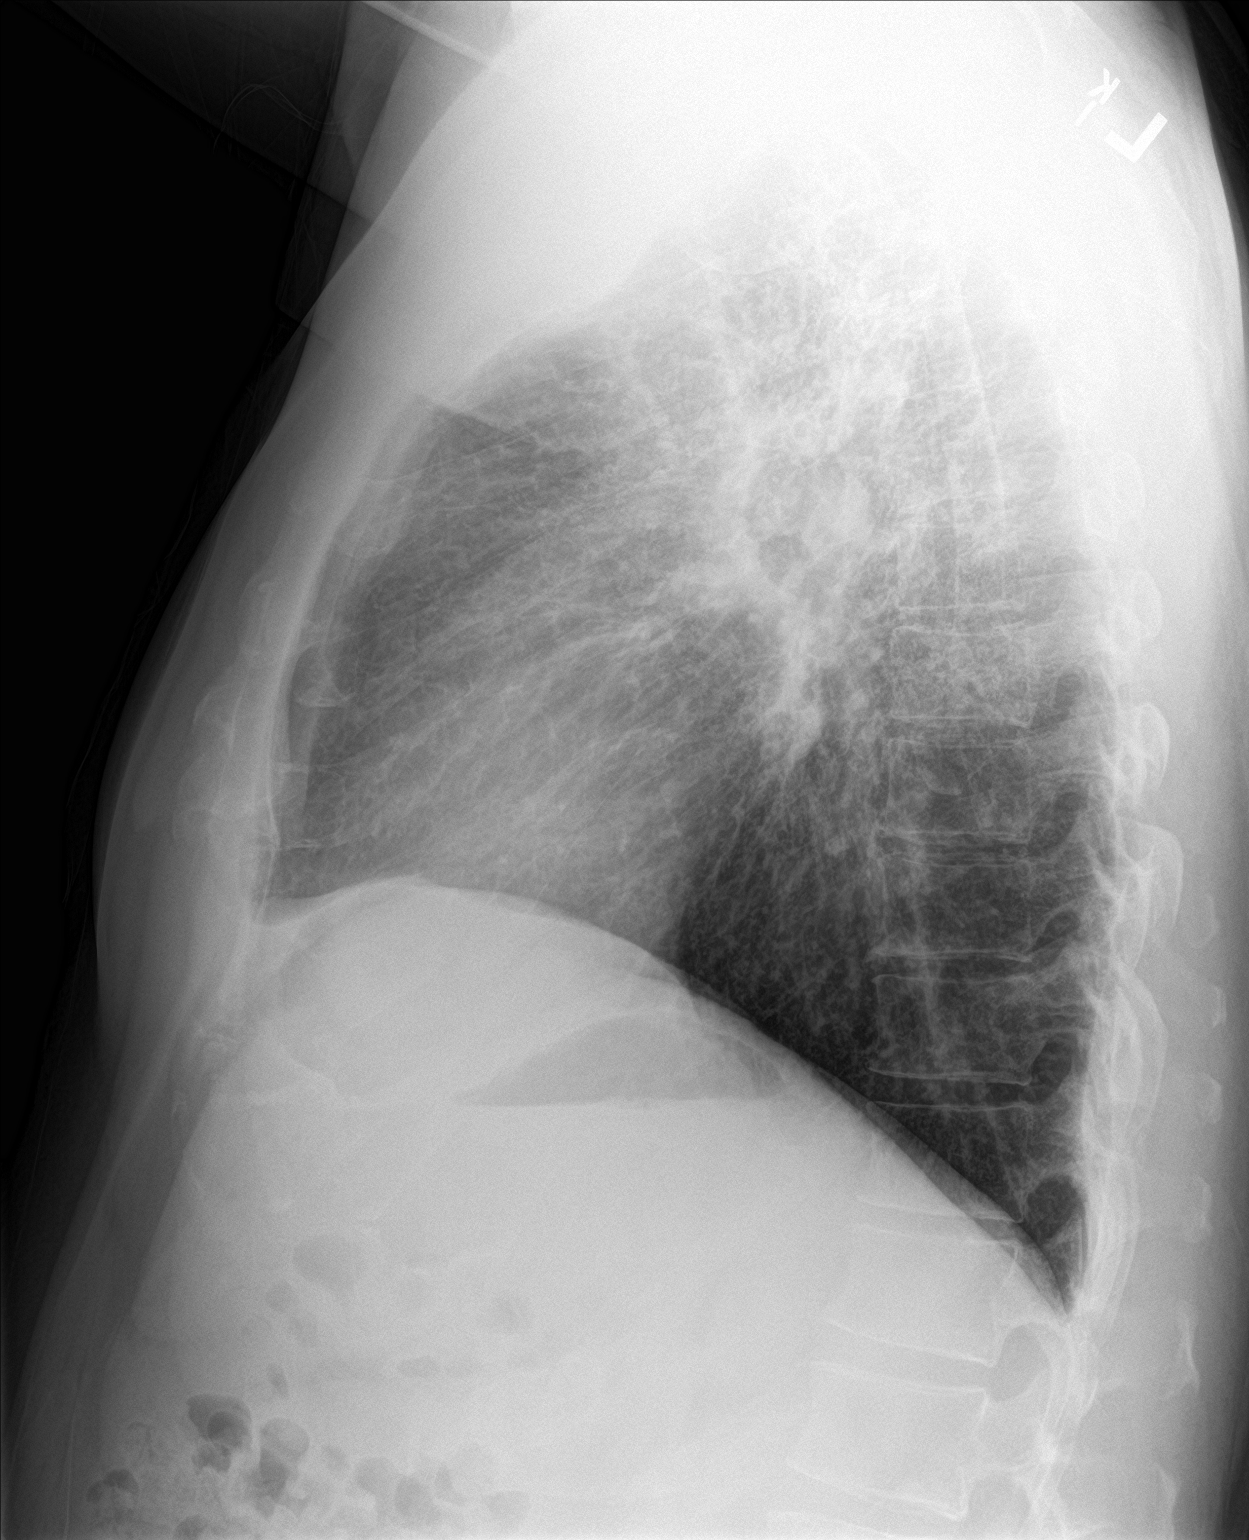

[2 of 2 positions shown; findings below may reference images not displayed]

FINDINGS: The heart size and mediastinal contours are within normal limits. No
pneumothorax or pleural effusion is noted. Stable bilateral upper
lobe interstitial densities are noted most consistent with scarring
or fibrosis. Superimposed acute edema or inflammation cannot be
excluded. The visualized skeletal structures are unremarkable.
IMPRESSION: Stable bilateral upper lobe interstitial densities are noted most
consistent with scarring or fibrosis, but acute superimposed edema
or inflammation cannot be excluded.

## 2019-07-24 ENCOUNTER — Other Ambulatory Visit: Payer: Self-pay | Admitting: *Deleted

## 2019-07-24 DIAGNOSIS — J449 Chronic obstructive pulmonary disease, unspecified: Secondary | ICD-10-CM

## 2019-07-24 MED ORDER — ALBUTEROL SULFATE HFA 108 (90 BASE) MCG/ACT IN AERS: INHALATION_SPRAY | RESPIRATORY_TRACT | 0 refills | Status: DC

## 2019-08-20 ENCOUNTER — Other Ambulatory Visit: Payer: Self-pay | Admitting: Family Medicine

## 2019-08-20 DIAGNOSIS — J449 Chronic obstructive pulmonary disease, unspecified: Secondary | ICD-10-CM

## 2020-04-01 ENCOUNTER — Other Ambulatory Visit: Payer: Self-pay | Admitting: Family Medicine

## 2020-04-01 DIAGNOSIS — J449 Chronic obstructive pulmonary disease, unspecified: Secondary | ICD-10-CM
